# Patient Record
Sex: Female | Born: 1970 | Race: Black or African American | Hispanic: No | Marital: Married | Smoking: Light tobacco smoker
Health system: Southern US, Community
[De-identification: ages and names within clinical notes are randomized; demographics above are authoritative.]

## PROBLEM LIST (undated history)

## (undated) DIAGNOSIS — R42 Dizziness and giddiness: Secondary | ICD-10-CM

## (undated) DIAGNOSIS — S7400XA Injury of sciatic nerve at hip and thigh level, unspecified leg, initial encounter: Secondary | ICD-10-CM

## (undated) DIAGNOSIS — R87619 Unspecified abnormal cytological findings in specimens from cervix uteri: Secondary | ICD-10-CM

## (undated) DIAGNOSIS — R7989 Other specified abnormal findings of blood chemistry: Secondary | ICD-10-CM

## (undated) DIAGNOSIS — G43909 Migraine, unspecified, not intractable, without status migrainosus: Secondary | ICD-10-CM

## (undated) DIAGNOSIS — R945 Abnormal results of liver function studies: Secondary | ICD-10-CM

## (undated) DIAGNOSIS — IMO0002 Reserved for concepts with insufficient information to code with codable children: Secondary | ICD-10-CM

## (undated) HISTORY — DX: Reserved for concepts with insufficient information to code with codable children: IMO0002

## (undated) HISTORY — DX: Migraine, unspecified, not intractable, without status migrainosus: G43.909

## (undated) HISTORY — DX: Unspecified abnormal cytological findings in specimens from cervix uteri: R87.619

## (undated) HISTORY — DX: Other specified abnormal findings of blood chemistry: R79.89

## (undated) HISTORY — DX: Abnormal results of liver function studies: R94.5

## (undated) HISTORY — PX: HEEL SPUR SURGERY: SHX665

## (undated) HISTORY — DX: Dizziness and giddiness: R42

## (undated) HISTORY — DX: Injury of sciatic nerve at hip and thigh level, unspecified leg, initial encounter: S74.00XA

---

## 1999-01-10 ENCOUNTER — Emergency Department (HOSPITAL_COMMUNITY): Admission: EM | Admit: 1999-01-10 | Discharge: 1999-01-10 | Payer: Self-pay | Admitting: Emergency Medicine

## 1999-07-13 ENCOUNTER — Other Ambulatory Visit: Admission: RE | Admit: 1999-07-13 | Discharge: 1999-07-13 | Payer: Self-pay

## 1999-10-20 ENCOUNTER — Emergency Department (HOSPITAL_COMMUNITY): Admission: EM | Admit: 1999-10-20 | Discharge: 1999-10-20 | Payer: Self-pay | Admitting: Emergency Medicine

## 2000-07-12 ENCOUNTER — Other Ambulatory Visit: Admission: RE | Admit: 2000-07-12 | Discharge: 2000-07-12 | Payer: Self-pay

## 2000-10-13 ENCOUNTER — Encounter: Payer: Self-pay | Admitting: Emergency Medicine

## 2000-10-13 ENCOUNTER — Emergency Department (HOSPITAL_COMMUNITY): Admission: EM | Admit: 2000-10-13 | Discharge: 2000-10-13 | Payer: Self-pay | Admitting: Emergency Medicine

## 2001-07-12 ENCOUNTER — Other Ambulatory Visit: Admission: RE | Admit: 2001-07-12 | Discharge: 2001-07-12 | Payer: Self-pay | Admitting: Family Medicine

## 2002-08-27 ENCOUNTER — Other Ambulatory Visit: Admission: RE | Admit: 2002-08-27 | Discharge: 2002-08-27 | Payer: Self-pay | Admitting: Obstetrics and Gynecology

## 2003-01-31 ENCOUNTER — Other Ambulatory Visit: Admission: RE | Admit: 2003-01-31 | Discharge: 2003-01-31 | Payer: Self-pay | Admitting: Obstetrics and Gynecology

## 2003-05-20 ENCOUNTER — Other Ambulatory Visit: Admission: RE | Admit: 2003-05-20 | Discharge: 2003-05-20 | Payer: Self-pay | Admitting: Obstetrics and Gynecology

## 2003-07-07 ENCOUNTER — Other Ambulatory Visit: Admission: RE | Admit: 2003-07-07 | Discharge: 2003-07-07 | Payer: Self-pay | Admitting: *Deleted

## 2003-09-01 ENCOUNTER — Other Ambulatory Visit: Admission: RE | Admit: 2003-09-01 | Discharge: 2003-09-01 | Payer: Self-pay | Admitting: *Deleted

## 2004-05-25 ENCOUNTER — Other Ambulatory Visit: Admission: RE | Admit: 2004-05-25 | Discharge: 2004-05-25 | Payer: Self-pay | Admitting: Obstetrics and Gynecology

## 2004-09-03 ENCOUNTER — Other Ambulatory Visit: Admission: RE | Admit: 2004-09-03 | Discharge: 2004-09-03 | Payer: Self-pay | Admitting: Obstetrics and Gynecology

## 2005-08-08 ENCOUNTER — Other Ambulatory Visit: Admission: RE | Admit: 2005-08-08 | Discharge: 2005-08-08 | Payer: Self-pay | Admitting: Obstetrics and Gynecology

## 2006-08-14 ENCOUNTER — Other Ambulatory Visit: Admission: RE | Admit: 2006-08-14 | Discharge: 2006-08-14 | Payer: Self-pay | Admitting: Obstetrics & Gynecology

## 2007-03-01 ENCOUNTER — Other Ambulatory Visit: Admission: RE | Admit: 2007-03-01 | Discharge: 2007-03-01 | Payer: Self-pay | Admitting: Obstetrics & Gynecology

## 2007-09-05 ENCOUNTER — Other Ambulatory Visit: Admission: RE | Admit: 2007-09-05 | Discharge: 2007-09-05 | Payer: Self-pay | Admitting: *Deleted

## 2007-12-10 ENCOUNTER — Emergency Department (HOSPITAL_COMMUNITY): Admission: EM | Admit: 2007-12-10 | Discharge: 2007-12-10 | Payer: Self-pay | Admitting: Emergency Medicine

## 2008-02-11 ENCOUNTER — Emergency Department (HOSPITAL_COMMUNITY): Admission: EM | Admit: 2008-02-11 | Discharge: 2008-02-11 | Payer: Self-pay | Admitting: Family Medicine

## 2008-04-11 ENCOUNTER — Emergency Department (HOSPITAL_COMMUNITY): Admission: EM | Admit: 2008-04-11 | Discharge: 2008-04-11 | Payer: Self-pay | Admitting: Family Medicine

## 2008-04-24 ENCOUNTER — Other Ambulatory Visit: Admission: RE | Admit: 2008-04-24 | Discharge: 2008-04-24 | Payer: Self-pay | Admitting: Obstetrics & Gynecology

## 2008-07-29 ENCOUNTER — Emergency Department (HOSPITAL_COMMUNITY): Admission: EM | Admit: 2008-07-29 | Discharge: 2008-07-29 | Payer: Self-pay | Admitting: Emergency Medicine

## 2008-08-11 ENCOUNTER — Emergency Department (HOSPITAL_COMMUNITY): Admission: EM | Admit: 2008-08-11 | Discharge: 2008-08-11 | Payer: Self-pay | Admitting: Family Medicine

## 2008-10-16 ENCOUNTER — Other Ambulatory Visit: Admission: RE | Admit: 2008-10-16 | Discharge: 2008-10-16 | Payer: Self-pay | Admitting: Obstetrics & Gynecology

## 2008-10-28 ENCOUNTER — Emergency Department (HOSPITAL_COMMUNITY): Admission: EM | Admit: 2008-10-28 | Discharge: 2008-10-28 | Payer: Self-pay | Admitting: Family Medicine

## 2009-04-01 ENCOUNTER — Other Ambulatory Visit: Admission: RE | Admit: 2009-04-01 | Discharge: 2009-04-01 | Payer: Self-pay | Admitting: Obstetrics & Gynecology

## 2010-08-28 HISTORY — PX: CERVICAL BIOPSY  W/ LOOP ELECTRODE EXCISION: SUR135

## 2011-02-27 DIAGNOSIS — R42 Dizziness and giddiness: Secondary | ICD-10-CM

## 2011-02-27 HISTORY — DX: Dizziness and giddiness: R42

## 2011-08-22 LAB — POCT URINALYSIS DIP (DEVICE)
Bilirubin Urine: NEGATIVE
Ketones, ur: NEGATIVE
Nitrite: NEGATIVE
Protein, ur: NEGATIVE
pH: 6

## 2011-08-31 LAB — URINALYSIS, ROUTINE W REFLEX MICROSCOPIC
Glucose, UA: NEGATIVE
Protein, ur: NEGATIVE
pH: 6

## 2011-08-31 LAB — URINE MICROSCOPIC-ADD ON

## 2011-08-31 LAB — POCT PREGNANCY, URINE: Preg Test, Ur: NEGATIVE

## 2012-08-10 ENCOUNTER — Other Ambulatory Visit: Payer: Self-pay | Admitting: Gastroenterology

## 2012-08-14 ENCOUNTER — Ambulatory Visit
Admission: RE | Admit: 2012-08-14 | Discharge: 2012-08-14 | Disposition: A | Discharge status: Home / Self Care | Payer: 59 | Source: Ambulatory Visit | Attending: Gastroenterology | Admitting: Gastroenterology

## 2013-06-27 ENCOUNTER — Encounter: Payer: Self-pay | Admitting: Obstetrics & Gynecology

## 2013-07-01 ENCOUNTER — Telehealth: Payer: Self-pay | Admitting: Certified Nurse Midwife

## 2013-07-01 ENCOUNTER — Ambulatory Visit (INDEPENDENT_AMBULATORY_CARE_PROVIDER_SITE_OTHER): Payer: 59 | Admitting: Obstetrics & Gynecology

## 2013-07-01 ENCOUNTER — Encounter: Payer: Self-pay | Admitting: Obstetrics & Gynecology

## 2013-07-01 VITALS — BP 100/62 | HR 78 | Resp 16 | Ht 66.0 in | Wt 181.2 lb

## 2013-07-01 DIAGNOSIS — Z124 Encounter for screening for malignant neoplasm of cervix: Secondary | ICD-10-CM

## 2013-07-01 DIAGNOSIS — Z0000 Encounter for general adult medical examination without abnormal findings: Secondary | ICD-10-CM

## 2013-07-01 DIAGNOSIS — Z01419 Encounter for gynecological examination (general) (routine) without abnormal findings: Secondary | ICD-10-CM

## 2013-07-01 LAB — POCT URINALYSIS DIPSTICK
Nitrite, UA: NEGATIVE
Urobilinogen, UA: NEGATIVE
pH, UA: 5

## 2013-07-01 MED ORDER — SUMATRIPTAN SUCCINATE 100 MG PO TABS: 100.0000 mg | ORAL_TABLET | ORAL | Status: DC | PRN

## 2013-07-01 NOTE — Patient Instructions (Signed)

## 2013-07-01 NOTE — Progress Notes (Signed)
42 y.o. [id number] MarriedAfrican AmericanF here for annual exam.  Had two bleeding episodes a month but is very light.  She does have to wear a light day pad.  Headaches are worse.  Patient's last menstrual period was 06/24/2013.          Sexually active: yes  The current method of family planning is IUD.    Exercising: no  not regularly Smoker:  yes  Health Maintenance: Pap:  06/25/12 WNL History of abnormal Pap:  yes MMG:  none Colonoscopy:  none BMD:   none TDaP:  10/04 Screening Labs: today, Hb today: 12.9, Urine today: RBC-trace   reports that she has been smoking.  She has never used smokeless tobacco. She reports that she drinks about 0.5 ounces of alcohol per week. She reports that she does not use illicit drugs.  Past Medical History  Diagnosis Date  . Vertigo 4/12  . Migraine   . Elevated LFTs   . Sciatic nerve injury     strain    Past Surgical History  Procedure Laterality Date  . Heel spur surgery Left     4/09  . Cervical biopsy  w/ loop electrode excision  10/11    CIN I & II- Margins Negative    Current Outpatient Prescriptions  Medication Sig Dispense Refill  . Aspirin-Acetaminophen-Caffeine (EXCEDRIN MIGRAINE PO) Take by mouth as needed.       . Levonorgestrel (SKYLA) 13.5 MG IUD by Intrauterine route.      . Multiple Vitamins-Minerals (MULTIVITAMIN PO) Take by mouth. Ulta Mega MVI       No current facility-administered medications for this visit.    Family History  Problem Relation Age of Onset  . Breast cancer Paternal Grandmother     and ovarian  . Hypertension Mother   . Diabetes Paternal Grandmother     ROS:  Pertinent items are noted in HPI.  Otherwise, a comprehensive ROS was negative.  Exam:   BP 100/62  Pulse 78  Resp 16  Ht 5\' 6"  (1.676 m)  Wt 181 lb 3.2 oz (82.192 kg)  BMI 29.26 kg/m2  LMP 06/24/2013  Weight change: +4lb  Height: 5\' 6"  (167.6 cm)  Ht Readings from Last 3 Encounters:  07/01/13 5\' 6"  (1.676 m)    General  appearance: alert, cooperative and appears stated age Head: Normocephalic, without obvious abnormality, atraumatic Neck: no adenopathy, supple, symmetrical, trachea midline and thyroid normal to inspection and palpation Lungs: clear to auscultation bilaterally Breasts: normal appearance, no masses or tenderness Heart: regular rate and rhythm Abdomen: soft, non-tender; bowel sounds normal; no masses,  no organomegaly Extremities: extremities normal, atraumatic, no cyanosis or edema Skin: Skin color, texture, turgor normal. No rashes or lesions Lymph nodes: Cervical, supraclavicular, and axillary nodes normal. No abnormal inguinal nodes palpated Neurologic: Grossly normal   Pelvic: External genitalia:  no lesions              Urethra:  normal appearing urethra with no masses, tenderness or lesions              Bartholins and Skenes: normal                 Vagina: normal appearing vagina with normal color and discharge, no lesions              Cervix: no lesions, IUD string noted              Pap taken: yes Bimanual Exam:  Uterus:  normal  size, contour, position, consistency, mobility, non-tender              Adnexa: normal adnexa and no mass, fullness, tenderness               Rectovaginal: Confirms               Anus:  normal sphincter tone, no lesions  A:  Well Woman with normal exam 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  Information given. pap smear today.  Needs yearly. Consider replacing Skyla with Mirena in 2 years. Rx for Imitrex 100mg  at headache onset.  Repeat 2 hours.  Max 200mg /24 hours. return annually or prn  An After Visit Summary was printed and given to the patient.

## 2013-07-02 LAB — COMPREHENSIVE METABOLIC PANEL
ALT: 18 U/L (ref 0–35)
Albumin: 4.2 g/dL (ref 3.5–5.2)
CO2: 27 mEq/L (ref 19–32)
Calcium: 9.8 mg/dL (ref 8.4–10.5)
Chloride: 102 mEq/L (ref 96–112)
Creat: 0.79 mg/dL (ref 0.50–1.10)
Total Protein: 7 g/dL (ref 6.0–8.3)

## 2013-07-02 LAB — LIPID PANEL
HDL: 64 mg/dL (ref >39)
LDL Cholesterol: 101 mg/dL — ABNORMAL HIGH (ref 0–99)
Total CHOL/HDL Ratio: 2.7 Ratio
Triglycerides: 50 mg/dL (ref <150)

## 2013-07-02 LAB — IPS PAP SMEAR ONLY

## 2013-08-19 ENCOUNTER — Telehealth: Payer: Self-pay

## 2013-08-19 NOTE — Telephone Encounter (Signed)
Patient c/o spotting x 2 weeks. States was heavier last week, especially Thursday-Sunday. States was having to wear a pad-one all day was fine, but this is abnormal for her. She was also having awful cramping. Please advise, is available to be seen if needed.

## 2013-08-19 NOTE — Telephone Encounter (Signed)
Need appt

## 2013-08-20 NOTE — Telephone Encounter (Signed)
Appointment to evaluate made for 08/22/13 with Dr Hyacinth Meeker. Patient aware to call if symptoms worsen.

## 2013-08-22 ENCOUNTER — Telehealth: Payer: Self-pay | Admitting: Obstetrics & Gynecology

## 2013-08-22 ENCOUNTER — Ambulatory Visit (INDEPENDENT_AMBULATORY_CARE_PROVIDER_SITE_OTHER): Payer: 59 | Admitting: Obstetrics & Gynecology

## 2013-08-22 VITALS — BP 120/80 | HR 60 | Resp 16 | Ht 66.25 in | Wt 182.2 lb

## 2013-08-22 DIAGNOSIS — N764 Abscess of vulva: Secondary | ICD-10-CM

## 2013-08-22 DIAGNOSIS — N938 Other specified abnormal uterine and vaginal bleeding: Secondary | ICD-10-CM

## 2013-08-22 DIAGNOSIS — G43909 Migraine, unspecified, not intractable, without status migrainosus: Secondary | ICD-10-CM

## 2013-08-22 DIAGNOSIS — N949 Unspecified condition associated with female genital organs and menstrual cycle: Secondary | ICD-10-CM

## 2013-08-22 MED ORDER — RIZATRIPTAN BENZOATE 10 MG PO TABS: 10.0000 mg | ORAL_TABLET | ORAL | Status: DC | PRN

## 2013-08-22 MED ORDER — SULFAMETHOXAZOLE-TMP DS 800-160 MG PO TABS: 1.0000 | ORAL_TABLET | Freq: Two times a day (BID) | ORAL | Status: DC

## 2013-08-22 NOTE — Telephone Encounter (Signed)
Routed to triage 

## 2013-08-22 NOTE — Telephone Encounter (Signed)
pharmacy calling to see if they can give the pt. 18 tablets instead of 2 or 9. The patients insurance will not cover unless it is written for 18 tablets.

## 2013-08-22 NOTE — Progress Notes (Signed)
Subjective:     Patient ID: Heather Acevedo, female   DOB: 08-Jul-1971, 42 y.o.   MRN: [id number]  HPI 43 yo G2A2 here to discuss several issues.  1)  Did have a migraine two weeks ago.  She took an Imitrex and it made her feel very "spacey".  It did help the headache but she didn't like the way it felt.  Wants to know if there are other options as this really did help headache.  2) Since IUD placement, has had short and light "staining" instead of cycles except for this month when she had what felt like a more normal cycle for her with cramping, heavier flow, and small clots.  Using IUD for [facility], so anxious about this.  3) Has h/o vulvar abscess and scarring, possible hidradenitis.  She had an abscess that came up so quickly and ruptured yesterday.  Area around it is still very tender to pt's touch.  Wants me to look at it.  No fevers.  No vaginal discharge.  No urinary symptoms.     Review of Systems  All other systems reviewed and are negative.       Objective:   Physical Exam  Constitutional: She is oriented to person, place, and time. She appears well-developed and well-nourished.  Abdominal: Soft. Bowel sounds are normal. She exhibits no distension and no mass. There is no tenderness. There is no rebound and no guarding.  Genitourinary: Vagina normal and uterus normal.    Uterus is not enlarged and not tender. Cervix exhibits motion tenderness. Cervix exhibits no discharge (with 2cm IUD string noted).  Lymphadenopathy:       Right: Inguinal adenopathy present.  Neurological: She is alert and oriented to person, place, and time.  Skin: Skin is warm and dry.  Psychiatric: She has a normal mood and affect.       Assessment:     Migraines Recurrent vulvar abscesses/hidratenitis? Cigar smoker DUB with Mirena IUD     Plan:     Bactrim DS bid x 7 days Skin culture of ruptured abscess maxalt 10mg  at HA onset, may repeat in 2 hours.  Rx to pharmacy.  May need prior-authorization PUS  if has continued irregular bleeding.  Pt to give update with next bleeding. Dermatology referral

## 2013-08-22 NOTE — Telephone Encounter (Signed)
Spoke with pharmacy, [facility] and advised new order was in system.

## 2013-08-23 ENCOUNTER — Encounter: Payer: Self-pay | Admitting: Obstetrics & Gynecology

## 2013-08-23 ENCOUNTER — Ambulatory Visit: Payer: Self-pay | Admitting: Obstetrics & Gynecology

## 2013-08-23 NOTE — Patient Instructions (Signed)
Will call with culture report and may need to continue antibiotics/change antibiotics depending on results.

## 2013-08-25 LAB — WOUND CULTURE: Gram Stain: NONE SEEN

## 2013-08-26 ENCOUNTER — Telehealth: Payer: Self-pay

## 2013-08-26 NOTE — Telephone Encounter (Signed)
Lmtcb//kn 

## 2013-08-26 NOTE — Telephone Encounter (Signed)
Spoke with patient re: culture results.

## 2013-08-26 NOTE — Telephone Encounter (Signed)
Message copied by Elisha Headland on Mon Aug 26, 2013 11:33 AM ------      Message from: Jerene Bears      Created: Sun Aug 25, 2013  4:08 PM       Please call and inform culture did not grow any bacteria.  This is probably because wasn't draining much when she came.  How is area.  She should still be on antibiotics and I want her to finish all 7 days. ------

## 2013-08-26 NOTE — Telephone Encounter (Signed)
Pt returning call

## 2013-08-26 NOTE — Telephone Encounter (Signed)
Patient notified of all results. 

## 2013-09-12 ENCOUNTER — Telehealth: Payer: Self-pay | Admitting: Obstetrics & Gynecology

## 2013-09-12 NOTE — Telephone Encounter (Signed)
Called patient to follow up with her in regards to her referral to dermatology. Patient's work location is closing and patient is transitioning to a new location. Her last day at this location is 11/16. Patient will reschedule after she is settle with her new location.  Patient tried to take Maxalt for her migraines but she had an allergic reaction to the medication. She stopped taking it immediately. Patient is going to go back to exedrin migraine for the time being.

## 2013-10-17 ENCOUNTER — Other Ambulatory Visit: Payer: Self-pay

## 2013-10-17 DIAGNOSIS — Z1231 Encounter for screening mammogram for malignant neoplasm of breast: Secondary | ICD-10-CM

## 2013-11-14 ENCOUNTER — Ambulatory Visit
Admission: RE | Admit: 2013-11-14 | Discharge: 2013-11-14 | Disposition: A | Discharge status: Home / Self Care | Payer: 59 | Source: Ambulatory Visit

## 2013-11-14 DIAGNOSIS — Z1231 Encounter for screening mammogram for malignant neoplasm of breast: Secondary | ICD-10-CM

## 2014-08-07 ENCOUNTER — Telehealth: Payer: Self-pay | Admitting: Obstetrics & Gynecology

## 2014-08-07 NOTE — Telephone Encounter (Signed)
Spoke with patient. Patient states that she has always had spotting 2-3 days out of the month with her IUD since it was inserted. States that yesterday she began to have bleeding that was a little more than spotting and "rich red" in color. Patient is changing panti liner 2 times per day. Patient denies any other symptoms."I just want to make sure that this is normal since it is heavier and a different color." Advised patient would send a message over to [place] and give her a call back with further recommendations and instructions. Patient agreeable.

## 2014-08-07 NOTE — Telephone Encounter (Signed)
Pt has questions about her iud.

## 2014-08-08 NOTE — Telephone Encounter (Signed)
As long as she can feel the strings, there is nothing to worry about.  If cannot feel strings, needs OV.

## 2014-08-08 NOTE — Telephone Encounter (Signed)
Spoke with patient. Patient states "She knows that I don't want to feel those things." Advised of importance of doing so. Advised if can not feel strings will need to be seen for office visit with [facility]. If she is able to feel them everything is okay. Patient is agreeable and will check for stings. Will call back if unable to feel to schedule appointment.  Routing to provider for final review. Patient agreeable to disposition. Will close encounter

## 2014-09-16 ENCOUNTER — Ambulatory Visit (INDEPENDENT_AMBULATORY_CARE_PROVIDER_SITE_OTHER): Payer: Managed Care, Other (non HMO) | Admitting: Obstetrics & Gynecology

## 2014-09-16 ENCOUNTER — Ambulatory Visit
Admission: RE | Admit: 2014-09-16 | Discharge: 2014-09-16 | Disposition: A | Discharge status: Home / Self Care | Payer: 59 | Source: Ambulatory Visit | Attending: Obstetrics & Gynecology | Admitting: Obstetrics & Gynecology

## 2014-09-16 ENCOUNTER — Encounter: Payer: Self-pay | Admitting: Obstetrics & Gynecology

## 2014-09-16 ENCOUNTER — Other Ambulatory Visit: Payer: Self-pay | Admitting: Obstetrics & Gynecology

## 2014-09-16 VITALS — BP 128/78 | HR 82 | Resp 16 | Ht 66.5 in | Wt 172.0 lb

## 2014-09-16 DIAGNOSIS — M899 Disorder of bone, unspecified: Secondary | ICD-10-CM

## 2014-09-16 DIAGNOSIS — Z975 Presence of (intrauterine) contraceptive device: Secondary | ICD-10-CM | POA: Insufficient documentation

## 2014-09-16 DIAGNOSIS — Z01419 Encounter for gynecological examination (general) (routine) without abnormal findings: Secondary | ICD-10-CM

## 2014-09-16 DIAGNOSIS — N938 Other specified abnormal uterine and vaginal bleeding: Secondary | ICD-10-CM | POA: Insufficient documentation

## 2014-09-16 DIAGNOSIS — Z124 Encounter for screening for malignant neoplasm of cervix: Secondary | ICD-10-CM

## 2014-09-16 DIAGNOSIS — Z0000 Encounter for general adult medical examination without abnormal findings: Secondary | ICD-10-CM

## 2014-09-16 LAB — POCT URINALYSIS DIPSTICK
LEUKOCYTES UA: NEGATIVE
PH UA: 5
Urobilinogen, UA: NEGATIVE

## 2014-09-16 LAB — HEMOGLOBIN, FINGERSTICK: Hemoglobin, fingerstick: 12.9 g/dL (ref 12.0–16.0)

## 2014-09-16 MED ORDER — SUMATRIPTAN SUCCINATE 100 MG PO TABS: 100.0000 mg | ORAL_TABLET | ORAL | Status: DC | PRN

## 2014-09-16 NOTE — Progress Notes (Signed)
43 y.o. G2P0020 Married African AmericanF here for annual exam.  Moved to [place] for a job promotion.  Still spotting twice a month.    Patient's last menstrual period was 09/02/2014.          Sexually active: Yes.    The current method of family planning is Skyla  IUD.    Placed 9/13. Exercising: Yes.    The patient does not participate in regular exercise at present. Smoker:  Yes 2-3x/wk  Health Maintenance: Pap:  07/01/13 Negative History of abnormal Pap: Yes  MMG: 11/15/13 Bi-Rads 1: Negative Colonoscopy:  Never had one BMD:   Never had done TDaP:  08/29/2003 Screening Labs: Yes , Hb today: 12.9 , Urine today: RBC'S ++   reports that she has been smoking.  She has never used smokeless tobacco. She reports that she drinks about .5 - 1 ounces of alcohol per week. She reports that she does not use illicit drugs.  Past Medical History  Diagnosis Date  . Vertigo 4/12  . Migraine   . Elevated LFTs   . Sciatic nerve injury     strain  . Abnormal Pap smear     LEEP CIN I and II, margins negative    Past Surgical History  Procedure Laterality Date  . Heel spur surgery Left     4/09  . Cervical biopsy  w/ loop electrode excision  10/11    CIN I & II- Margins Negative    Current Outpatient Prescriptions  Medication Sig Dispense Refill  . Acetaminophen (TYLENOL EXTRA STRENGTH PO) Take by mouth as needed.      . Aspirin-Acetaminophen-Caffeine (EXCEDRIN MIGRAINE PO) Take by mouth as needed.       . Levonorgestrel (SKYLA) 13.5 MG IUD by Intrauterine route.      . Multiple Vitamins-Minerals (MULTIVITAMIN PO) Take by mouth. Ulta Mega MVI      . SUMAtriptan (IMITREX) 100 MG tablet Take 1 tablet (100 mg total) by mouth every 2 (two) hours as needed for migraine.  9 tablet  12   No current facility-administered medications for this visit.    Family History  Problem Relation Age of Onset  . Breast cancer Paternal Grandmother     and ovarian  . Hypertension Mother   . Diabetes  Paternal Grandmother     ROS:  Pertinent items are noted in HPI.  Otherwise, a comprehensive ROS was negative.  Exam:   BP 128/78  Pulse 82  Resp 16  Ht 5' 6.5" (1.689 m)  Wt 172 lb (78.019 kg)  BMI 27.35 kg/m2  LMP 09/02/2014  Weight change: -10#  Height: 5' 6.5" (168.9 cm)  Ht Readings from Last 3 Encounters:  09/16/14 5' 6.5" (1.689 m)  08/22/13 5' 6.25" (1.683 m)  07/01/13 5\' 6"  (1.676 m)    General appearance: alert, cooperative and appears stated age Head: Normocephalic, without obvious abnormality, atraumatic Neck: no adenopathy, supple, symmetrical, trachea midline and thyroid normal to inspection and palpation Lungs: clear to auscultation bilaterally Breasts: normal appearance, no masses or tenderness, rib prominence on Left chest wall Heart: regular rate and rhythm Abdomen: soft, non-tender; bowel sounds normal; no masses,  no organomegaly Extremities: extremities normal, atraumatic, no cyanosis or edema Skin: Skin color, texture, turgor normal. No rashes or lesions Lymph nodes: Cervical, supraclavicular, and axillary nodes normal. No abnormal inguinal nodes palpated Neurologic: Grossly normal   Pelvic: External genitalia:  no lesions  Urethra:  normal appearing urethra with no masses, tenderness or lesions              Bartholins and Skenes: normal                 Vagina: normal appearing vagina with normal color and discharge, no lesions              Cervix: no lesions, IUD string noted              Pap taken: Yes.  Bleeding with pap noted.  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 Information given.  pap smear today. Needs yearly.  D/W pt PUS to assess DUB which I feel I probably IUD related.  Also discussed BTL or vasectomy with subsequent IUD removal.  Pt will consider CXR today.  Rx given. Rx for Imitrex 100mg  at headache onset. Repeat 2 hours. Max 200mg /24 hours.  #9/13 return annually or prn   An After Visit Summary was printed and given to the patient.

## 2014-09-17 ENCOUNTER — Telehealth: Payer: Self-pay | Admitting: Obstetrics & Gynecology

## 2014-09-17 LAB — IPS PAP TEST WITH REFLEX TO HPV

## 2014-09-17 NOTE — Telephone Encounter (Signed)
Dr.Silva, could you please review patient's chest x-ray results from 10/20 as Dr.Miller is out of the office today. Thank you.

## 2014-09-17 NOTE — Telephone Encounter (Signed)
Pt is calling to check and see if images are in from a visit she had yesterday.

## 2014-09-17 NOTE — Telephone Encounter (Signed)
Patient notified. Will follow up prn.  Routing to provider for final review. Patient agreeable to disposition. Will close encounter

## 2014-09-17 NOTE — Telephone Encounter (Signed)
Message copied by Michele Mcalpine on Wed Sep 17, 2014 11:39 AM ------      Message from: [name]      Created: Wed Sep 17, 2014  8:28 AM       Please let pt know CXR did not show any abnormalities. ------

## 2014-09-29 ENCOUNTER — Encounter: Payer: Self-pay | Admitting: Obstetrics & Gynecology

## 2014-11-13 ENCOUNTER — Telehealth: Payer: Self-pay | Admitting: Emergency Medicine

## 2014-11-13 NOTE — Telephone Encounter (Signed)
-----   Message from Lyman Speller, MD sent at 11/09/2014  2:04 PM EST ----- Regarding: ultrasound in [place] Pt with hx of DUB.  Has IUD and h/o fibroids.  Needs PUS in [place].  Can you see if can get scheduled.  Pt moved there due to promotion but doesn't want to change gynecologists right now.  Pt seen in October and I've been holding this on my desk for weeks to get scheduled.  She will not be surprised by call or upset.  She was very busy with move and wanted to wait some weeks before getting it scheduled.  Thanks.  MSM

## 2014-11-13 NOTE — Telephone Encounter (Signed)
Spoke with patient. Agreeable to scheduling Pelvic ultrasound  And transvaginal ultrasound for 11/17/14 at 1215.  Scheduled at [facility] at [facility]. [place], [place] [address] phone [phone] Faxed signed order with fax confirmation received.  Patient aware, instructions to drink 32 oz of water one hour prior to appointment and hold bladder given.   Patient agreeable to instructions and plan. Will call with any concerns. Imaging hold for results.

## 2014-12-04 ENCOUNTER — Telehealth: Payer: Self-pay | Admitting: Emergency Medicine

## 2014-12-04 NOTE — Telephone Encounter (Signed)
Encounter can be closed.  Pt was aware of recommendation and appt made for her.  Non-compliance noted.  Thank you for the update.

## 2014-12-04 NOTE — Telephone Encounter (Signed)
Dr. Sabra Heck,    Patient was scheduled for 11/17/14 in [place] for pelvic ultrasound. She confirmed the appointment with me on the phone encounter dated 11/13/14. She subsequently called the imaging center and said she was going out of town and did not want to reschedule, that she would call back to schedule when she returned to town. Patient has not since rescheduled.

## 2015-01-20 ENCOUNTER — Telehealth: Payer: Self-pay | Admitting: Obstetrics & Gynecology

## 2015-01-20 DIAGNOSIS — N83202 Unspecified ovarian cyst, left side: Principal | ICD-10-CM

## 2015-01-20 DIAGNOSIS — N83201 Unspecified ovarian cyst, right side: Secondary | ICD-10-CM

## 2015-01-20 NOTE — Telephone Encounter (Signed)
Spoke with patient. Patient states that she had PUS done in [place] on 2/19. "They said that they would send the results over to the office that day and I should hear from Someone Tuesday at the latest." Advised that Dr.Miller is out of the office this week but I will look for results and have covering provider Dr.Silva review and return call with further information. Patient is agreeable.

## 2015-01-20 NOTE — Telephone Encounter (Signed)
Spoke with [employer] in [place] results from ultrasound on 2/19 will be faxed to our office for Dr.Silva's review at this time.

## 2015-01-20 NOTE — Telephone Encounter (Signed)
Spoke with patient. Advised I have contacted [facility] for results and will give her a call as soon as we have received them and Dr.Silva has reviewed. Patient is agreeable.

## 2015-01-20 NOTE — Telephone Encounter (Signed)
Patient is calling to get her results from her ultrasound

## 2015-01-22 NOTE — Telephone Encounter (Signed)
Attempted to reach patient at number provided [phone]. No answer and no voicemail available. Will try again later.

## 2015-01-22 NOTE — Telephone Encounter (Signed)
Report from [facility] 01/16/15  Uterus 8.1 x 3.4 x 4.2 cm.  9 mm fundal fibroid.  EMS 2 mm. Right ovary 6.5 x 5.1 x 3.8 cm.  4.6 cm anechoic cyst consistent with physiologic follicle. Left ovary 2.0 x 2.7 x 2.3 cm.  Complex septate hypoechoic mass consistent with Cl or hemorrhagic cyst.  Small free fluid in cul de sac.   Recommendations as below.

## 2015-01-22 NOTE — Telephone Encounter (Signed)
Patient calling to check on the status of the results.

## 2015-01-22 NOTE — Telephone Encounter (Signed)
Spoke with patient. Advised patient we have received results from [facility] which Dr.Silva has reviewed. Patient has bilateral ovarian cysts with a very small fibroid. Dr.Silva recommends a follow up ultrasound with Dr.Miller in 6 weeks. Appointment scheduled for 03/05/15 at 1pm with 1:30pm consult with Dr.Miller. Patient is agreeable to date and time. Patient verbalized understanding of the [facility] appointment cancellation policy. Advised will need to cancel or reschedule within 72 business hours of appointment (3 business days) or will have $100.00 late cancellation fee placed to account. Torsion precautions given. PUS order placed. Will need precert. Results sent to scan.  SW:FUXNATF [place] [employer] for [employer] to provider for final review. Patient agreeable to disposition. Will close encounter

## 2015-01-22 NOTE — Telephone Encounter (Signed)
Dr.Silva results from PUS to your desk for review.

## 2015-01-26 NOTE — Telephone Encounter (Signed)
The ultrasound did not say anything about presence of IUD.  Please call and ask [facility] to look at images and advise if they can see IUD.  Thanks.

## 2015-01-26 NOTE — Telephone Encounter (Signed)
Spoke with [facility]. Will have Dr.Gordon review results and make any additional notes regarding IUD. Once report has been reviewed new report will be faxed with corrections to our office.

## 2015-02-02 NOTE — Telephone Encounter (Signed)
Did receive addended ultrasound report from [facility] with IUD in correct location.  Pt already scheduled for ultrasound here in early April.  Thanks.

## 2015-03-05 ENCOUNTER — Ambulatory Visit (INDEPENDENT_AMBULATORY_CARE_PROVIDER_SITE_OTHER): Payer: Managed Care, Other (non HMO) | Admitting: Obstetrics & Gynecology

## 2015-03-05 ENCOUNTER — Ambulatory Visit (INDEPENDENT_AMBULATORY_CARE_PROVIDER_SITE_OTHER): Payer: Managed Care, Other (non HMO)

## 2015-03-05 VITALS — BP 102/70 | Wt 172.0 lb

## 2015-03-05 DIAGNOSIS — N8320 Unspecified ovarian cysts: Secondary | ICD-10-CM

## 2015-03-05 DIAGNOSIS — D251 Intramural leiomyoma of uterus: Secondary | ICD-10-CM | POA: Diagnosis not present

## 2015-03-05 DIAGNOSIS — N83201 Unspecified ovarian cyst, right side: Secondary | ICD-10-CM

## 2015-03-05 DIAGNOSIS — N938 Other specified abnormal uterine and vaginal bleeding: Secondary | ICD-10-CM | POA: Diagnosis not present

## 2015-03-05 DIAGNOSIS — N83202 Unspecified ovarian cyst, left side: Principal | ICD-10-CM

## 2015-03-05 DIAGNOSIS — Z975 Presence of (intrauterine) contraceptive device: Secondary | ICD-10-CM

## 2015-03-05 MED ORDER — FLUOXETINE HCL 20 MG PO CAPS: 20.0000 mg | ORAL_CAPSULE | Freq: Every day | ORAL | Status: DC

## 2015-03-05 MED ORDER — FLUOXETINE HCL (PMDD) 20 MG PO CAPS: 10.0000 mg | ORAL_CAPSULE | Freq: Every day | ORAL | Status: DC

## 2015-03-05 NOTE — Progress Notes (Signed)
44 y.o. Heather Acevedo Married AA female here for a pelvic ultrasound to follow up on 4.6cm right ovarian cyst that was noted on outside ultrasound done in February due to DUB with her Heather Acevedo IUD.  Pt also had a probable corpus luteal cyst on the left ovary at the same time.  IUD was noted to be in correct location.  Pt denies pain during any time during the last several months.  Biggest frustration to her is the DUB.  Pt is still smoking and cannot be on estrogen containing OCPs.  She is clearly aware of this despite doing very well on them in the past.  Pt not in [place] any longer.  Was promoted again and is now is [place].  She lives part time with her sister and commutes back and forth, some, to [place] where husband lives.  No LMP recorded.  Sexually active:  yes  Contraception: Skyla IUD  FINDINGS: UTERUS: 6.1 x 5.5 x 3.7cm with fundal 61mm fibroid EMS: IUD in correct location ADNEXA:   Left ovary 3.0 x 1.4 x 1.7cm with collapsed 1.2cm corpus luteal cyst   Right ovary 2.9 x 2.1 x 5.1ZG with 0.1VC follicle CUL DE SAC: no free fluid  Images reviewed with pt.  Absence of large cyst is noted.  Corpus luteal cyst on left noted.  Small fibroid noted.  Correct IUD location note.  Pt aware of above findings.  Would like to proceed with another IUD at the end of the summer, when IUD should be removed.  Assessment:  DUB with Skyla IUD, correct IUD location Small 30mm fundal fibroid 4.6cm right ovarian cyst, resolved Anxiety with work stressors  Plan: Return for hopeful Mirena IUD placement in late 8/16.  Will have IUD placement and IUD precerted for pt.   Restart Prozac 20mg  daily.  rx to pharmacy.  Pt aware of risks of serotonin syndrome and nausea during first week of use.  ~15 minutes spent with patient >50% of time was in face to face discussion of above.

## 2015-03-06 ENCOUNTER — Other Ambulatory Visit: Payer: Self-pay

## 2015-03-06 DIAGNOSIS — Z30014 Encounter for initial prescription of intrauterine contraceptive device: Secondary | ICD-10-CM

## 2015-03-09 ENCOUNTER — Encounter: Payer: Self-pay | Admitting: Obstetrics & Gynecology

## 2015-03-09 DIAGNOSIS — D251 Intramural leiomyoma of uterus: Secondary | ICD-10-CM | POA: Insufficient documentation

## 2015-05-26 ENCOUNTER — Telehealth: Payer: Self-pay | Admitting: Obstetrics & Gynecology

## 2015-05-26 MED ORDER — SULFAMETHOXAZOLE-TRIMETHOPRIM 800-160 MG PO TABS: 1.0000 | ORAL_TABLET | Freq: Two times a day (BID) | ORAL | Status: DC

## 2015-05-26 NOTE — Telephone Encounter (Signed)
Call to patient. She is advised that Dr. Sabra Heck approved prescription treatment for her. Advised to return call if symptoms worsen or do not improve after treatment with antibiotics in 1-2 days. Advised patient to be seen at local ER or Urgent care if develops fevers, chills, or any concerns. Patient verbalized understanding. Will follow up as directed.   Routing to provider for final review. Patient agreeable to disposition. Will close encounter.

## 2015-05-26 NOTE — Telephone Encounter (Signed)
Call to patient. She c/o L vulvar abscess x 2 days. She states she has a history of these abscesses and states she was advised by Dr. Sabra Heck at previous visits that she could call in treatment if necessary. Denies fevers, or swelling of the area, but reports pain. She states she has been "leaving the area alone" as directed by Dr. Sabra Heck previously.  Patient declines office visit offered today for evaluation. Patient states she works in [place] and schedule is unpredictable.   Advised would send message to Dr. Sabra Heck for recommendations.

## 2015-05-26 NOTE — Telephone Encounter (Signed)
Patient has a cyst.

## 2015-05-26 NOTE — Telephone Encounter (Signed)
OK to treat with bactrim DS bid x 7 days.  Thanks.

## 2015-06-21 ENCOUNTER — Other Ambulatory Visit: Payer: Self-pay | Admitting: Obstetrics & Gynecology

## 2015-06-22 NOTE — Telephone Encounter (Signed)
Medication refill request: Prozac 20 Last AEX:  09/16/14 with SM Next AEX: 11/02/15 with SM Last MMG (if hormonal medication request):  Refill authorized: Please advise.

## 2015-07-10 ENCOUNTER — Ambulatory Visit (INDEPENDENT_AMBULATORY_CARE_PROVIDER_SITE_OTHER): Payer: Managed Care, Other (non HMO) | Admitting: Obstetrics & Gynecology

## 2015-07-10 VITALS — BP 100/62 | HR 64 | Resp 16 | Wt 157.0 lb

## 2015-07-10 DIAGNOSIS — Z30433 Encounter for removal and reinsertion of intrauterine contraceptive device: Secondary | ICD-10-CM

## 2015-07-10 DIAGNOSIS — Z30014 Encounter for initial prescription of intrauterine contraceptive device: Secondary | ICD-10-CM

## 2015-07-10 MED ORDER — SUMATRIPTAN SUCCINATE 100 MG PO TABS: 100.0000 mg | ORAL_TABLET | ORAL | Status: DC | PRN

## 2015-07-10 NOTE — Addendum Note (Signed)
Addended by: [name] on: 07/10/2015 11:03 AM   Modules accepted: Orders

## 2015-07-10 NOTE — Progress Notes (Addendum)
44 y.o. [id number] Married [place] American female presents for removal of Skyla IUD and insertion of Skyla IUD. She has been counseled about alternative forms of birth control including oral contraceptives, progesterone methods, IUD, barrier method, and sterilization.  She has decided to proceed with IUD placement.  Currently, she denies any vaginal symptoms or STD concerns.  LMP:  Patient's last menstrual period was 07/07/2015.  Gen:  WNWF healthy female NAD Abdomen: soft, non-tender Groin:  no inguinal nodes palpated  Pelvic exam: Vulva:  normal female genitalia Vagina:  normal vagina, no discharge, exudate, lesion, or erythema Cervix:  Non-tender, Negative CMT, no lesions or redness, IUD string noted  After patient read information booklet and all questions were answered, informed consent was obtained.      Procedure:  Speculum inserted into vagina. Cervix visualized and cleansed with betadine solution X 3. Paracervical block placed:  yes, 1% Lidocaine lot # [id number].  Exp 11/29/15.  Tenaculum placed on cervix at 12 o'clock position.  IUD removed with one pull of IUD string.  Uterus sounded to 6 centimeters.  IUD and inserting device removed from sterile packet and under sterile conditions inserted to fundus of uterus.  IUD released and introducer removed without difficulty.  IUD string trimmed to 2 centimeters.  Remainder string given to patient to feel for identification.  Tenaculum removed.  Minimal bleeding noted.  Speculum removed.  Uterus palpated normal.  Patient tolerated procedure well.  Skyla IUD Lot #:[id number].  Exp: 2/17.  Package information attached to consent and scanned into EPIC.  A: Removal of Skyla IUD and Insertion of new Skyla IUD   P: Return to office 6-8 weeks for recheck Pt knows IUD needs to be replaced approximately 3 years, no later than 07/10/15.  Instructions provided.  Imitrex rx transferred to [facility] in [place] per pt request.

## 2015-07-10 NOTE — Addendum Note (Signed)
Addended by: [name] on: 07/10/2015 11:01 AM   Modules accepted: Level of Service

## 2015-08-24 ENCOUNTER — Telehealth: Payer: Self-pay | Admitting: Obstetrics & Gynecology

## 2015-08-24 ENCOUNTER — Ambulatory Visit (INDEPENDENT_AMBULATORY_CARE_PROVIDER_SITE_OTHER): Payer: Managed Care, Other (non HMO) | Admitting: Obstetrics and Gynecology

## 2015-08-24 ENCOUNTER — Ambulatory Visit (HOSPITAL_COMMUNITY)
Admission: RE | Admit: 2015-08-24 | Discharge: 2015-08-24 | Disposition: A | Discharge status: Home / Self Care | Payer: Managed Care, Other (non HMO) | Source: Ambulatory Visit | Attending: Obstetrics and Gynecology | Admitting: Obstetrics and Gynecology

## 2015-08-24 ENCOUNTER — Encounter: Payer: Self-pay | Admitting: Obstetrics and Gynecology

## 2015-08-24 ENCOUNTER — Telehealth: Payer: Self-pay

## 2015-08-24 VITALS — BP 118/80 | HR 80 | Resp 16 | Wt 161.0 lb

## 2015-08-24 DIAGNOSIS — R1032 Left lower quadrant pain: Secondary | ICD-10-CM | POA: Diagnosis present

## 2015-08-24 DIAGNOSIS — N8320 Unspecified ovarian cysts: Secondary | ICD-10-CM | POA: Diagnosis not present

## 2015-08-24 DIAGNOSIS — R319 Hematuria, unspecified: Secondary | ICD-10-CM | POA: Diagnosis not present

## 2015-08-24 DIAGNOSIS — Z975 Presence of (intrauterine) contraceptive device: Secondary | ICD-10-CM | POA: Insufficient documentation

## 2015-08-24 DIAGNOSIS — D259 Leiomyoma of uterus, unspecified: Secondary | ICD-10-CM | POA: Diagnosis not present

## 2015-08-24 DIAGNOSIS — N83202 Unspecified ovarian cyst, left side: Secondary | ICD-10-CM

## 2015-08-24 LAB — POCT URINALYSIS DIPSTICK
Bilirubin, UA: NEGATIVE
GLUCOSE UA: NEGATIVE
Ketones, UA: NEGATIVE
Leukocytes, UA: NEGATIVE
NITRITE UA: NEGATIVE
Protein, UA: NEGATIVE
UROBILINOGEN UA: NEGATIVE
pH, UA: 7

## 2015-08-24 MED ORDER — OXYCODONE-ACETAMINOPHEN 5-325 MG PO TABS: ORAL_TABLET | ORAL | Status: DC

## 2015-08-24 NOTE — Patient Instructions (Signed)
Call with worsening pain or any other concerns, f/u in 48 hours if your pain isn't improving.

## 2015-08-24 NOTE — Telephone Encounter (Signed)
Spoke with patient. She complaints of L sided pain that she feels is coming from her L ovary that started this morning. She describes pain as sharp and piercing. Using ibuprofen without relief. Patient denies vaginal bleeding or discharge. No pain with urination. No fevers, no nausea, no vomiting, no diarrhea. Patient has not checked her IUD strings. She is coming from [place] and feels she can wait to come here for appointment, husband is driving her. Advised patient if pain increases or any concerns to be seek care locally at emergency department. Patient verbalizes understanding.   Patient with history of Ovarian Cyst/Fibroids. Last ultrasound with Dr. Sabra Heck 02/2015. Skyla IUD for contraception, placed by Dr. Sabra Heck 07/10/15.   Scheduled office visit with Dr. Talbert Nan today at 1100. Patient agreeable.  Routing to provider for final review. Patient agreeable to disposition. Will close encounter.

## 2015-08-24 NOTE — Progress Notes (Signed)
Scheduled patient while in office today for STAT ultrasound at [facility] for today at 1:15 pm. Patient is agreeable to date and time. Report to be called to office to [place].

## 2015-08-24 NOTE — Telephone Encounter (Signed)
Took report at time of incoming call from [place] at the [facility] on patient's ultrasound results. Results seen below. Results discussed with Dr.Jertson who would like patient to schedule follow up PUS with our office. Patient will need to return call with worsening symptoms. Needs to advise this cyst should go away with time, but need to monitor.  IMPRESSION: 1. No acute abnormality. 2. Complex LEFT ovarian cystic lesion with thick septations with vascular flow. 6-12 week follow-up ultrasound recommended to assess for resolution. 3. Doppler waveform evaluation was not ordered or performed so definitive evaluation for portion is not possible.

## 2015-08-24 NOTE — Telephone Encounter (Signed)
Patient called and said, "I would like an appointment today. I am having sharp stabbing pains in my left ovary I am not sure may be related to my IUD. I called into work today because the pain is so bad."

## 2015-08-24 NOTE — Progress Notes (Signed)
Patient ID: Heather Acevedo, female   DOB: September 14, 1971, 44 y.o.   MRN: [id number] GYNECOLOGY  VISIT   HPI: 44 y.o.   Married  [place] American  female   [phone] with Patient's last menstrual period was 08/08/2015.   here for   LLQ pain that started this morning. The patient got up to go to the bathroom this morning. She had a BM, the pain started right after that. The pain is sharp and constant. 9/10 in severity. Helps to curl up a little or if she pushes on her abdomen when she gets up. Ibuprofen didn't help at all. No fever, chills, nausea, emesis, diarrhea, no constipation, no urinary symptoms. The patient has a skyla IUD, placed last month. She has light menses every month. LMP 08/08/15.   GYNECOLOGIC HISTORY: Patient's last menstrual period was 08/08/2015. Contraception:IUD Menopausal hormone therapy: N/A        OB History    Gravida Para Term Preterm AB TAB SAB Ectopic Multiple Living   2    2     0         Patient Active Problem List   Diagnosis Date Noted  . Fibroids, intramural 03/09/2015  . DUB (dysfunctional uterine bleeding) 09/16/2014  . IUD (intrauterine device) in place 09/16/2014    Past Medical History  Diagnosis Date  . Vertigo 4/12  . Migraine   . Elevated LFTs   . Sciatic nerve injury     strain  . Abnormal Pap smear     LEEP CIN I and II, margins negative    Past Surgical History  Procedure Laterality Date  . Heel spur surgery Left     4/09  . Cervical biopsy  w/ loop electrode excision  10/11    CIN I & II- Margins Negative    Current Outpatient Prescriptions  Medication Sig Dispense Refill  . Acetaminophen (TYLENOL EXTRA STRENGTH PO) Take by mouth as needed.    . Aspirin-Acetaminophen-Caffeine (EXCEDRIN MIGRAINE PO) Take by mouth as needed.     Marland Kitchen FLUoxetine (PROZAC) 20 MG capsule TAKE 1 CAPSULE (20 MG TOTAL) BY MOUTH DAILY. 30 capsule 5  . Levonorgestrel (SKYLA) 13.5 MG IUD by Intrauterine route.    . Multiple Vitamins-Minerals (MULTIVITAMIN PO)  Take by mouth. Ulta Mega MVI    . SUMAtriptan (IMITREX) 100 MG tablet Take 1 tablet (100 mg total) by mouth every 2 (two) hours as needed for migraine. 9 tablet 5  . trimethoprim-polymyxin b (POLYTRIM) ophthalmic solution   0   No current facility-administered medications for this visit.     ALLERGIES: Doxycycline  Family History  Problem Relation Age of Onset  . Breast cancer Paternal Grandmother     and ovarian  . Hypertension Mother   . Diabetes Paternal Grandmother     Social History   Social History  . Marital Status: Married    Spouse Name: N/A  . Number of Children: N/A  . Years of Education: N/A   Occupational History  . Not on file.   Social History Main Topics  . Smoking status: Light Tobacco Smoker  . Smokeless tobacco: Never Used     Comment: 2-3x'sper week  . Alcohol Use: 0.5 - 1.0 oz/week    1-2 drink(s) per week     Comment: twice a month  . Drug Use: No  . Sexual Activity:    Partners: Male    Birth Control/ Protection: IUD     Comment: skyla   Other Topics Concern  .  Not on file   Social History Narrative    Review of Systems  Gastrointestinal: Positive for abdominal pain.  Genitourinary:       Painful periods   All other systems reviewed and are negative.   PHYSICAL EXAMINATION:    BP 118/80 mmHg  Pulse 80  Resp 16  Wt 161 lb (73.029 kg)  LMP 08/08/2015    General appearance: alert, cooperative and appears stated age Abdomen: soft, mildly tender in the LLQ, no rebound, no guarding, no masses  Pelvic: External genitalia:  no lesions              Urethra:  normal appearing urethra with no masses, tenderness or lesions              Bartholins and Skenes: normal                 Vagina: normal appearing vagina, increase in thick white vaginal d/c (not symptomatic)              Cervix: no lesions and no CMT , IUD string less than 1 cm              Bimanual Exam:  Uterus:  normal size, contour, position, consistency, mobility,  non-tender              Adnexa: Tender with some fullness in the left adnexa, normal right adnexa              Rectovaginal: Yes.  .  Confirms.              Anus:  normal sphincter tone, no lesions  Chaperone was present for exam.  Urine dip with 2+ blood, otherwise negative  ASSESSMENT LLQ abdominal pain, has a skyla IUD Suspect ovarian cyst  Hematuria  PLAN Ultrasound today Percocet for pain Further plan after the ultrasound returns Urine for ua, c&s   An After Visit Summary was printed and given to the patient.

## 2015-08-24 NOTE — Telephone Encounter (Signed)
Spoke with patient. Advised of results as seen below. Advised Dr.Jertson recommends follow up PUS in 6-12 weeks. Will need to notify our office if symptoms worsen. Patient is agreeable. PUS scheduled for 10/06/2015 at 1:30 pm with 2 pm consult with Dr.Jertson. Patient is agreeable to date and time. Orders will need precert.  Cc: Theresia Lo  Dr.Jertson, agree with plan?

## 2015-08-25 LAB — URINALYSIS, MICROSCOPIC ONLY
BACTERIA UA: NONE SEEN [HPF]
CASTS: NONE SEEN [LPF]
Crystals: NONE SEEN [HPF]
RBC / HPF: NONE SEEN RBC/HPF (ref <=2)
WBC, UA: NONE SEEN WBC/HPF (ref <=5)
Yeast: NONE SEEN [HPF]

## 2015-08-25 NOTE — Telephone Encounter (Signed)
Sounds good. I realize she is a patient of Dr Sanjuan Dame and should f/u with her.

## 2015-08-25 NOTE — Telephone Encounter (Signed)
Spoke with patient. Patient states she feels "much better" today than yesterday. Patient was able to go to work today. "I have taken ibuprofen and have only had a couple of pains today." Denies any worsening of pain. PUS with Dr Talbert Nan cancelled. PUS scheduled with Dr.Miller for 11/10 at 12:30 pm with 12:45 pm consult with Dr.Miller. Patient is agreeable to date and time. Follow up for IUD check cancelled for 09/13/2015 as Dr.Jertson states it is okay since IUD was checked with PUS on 9/26. Patient is agreeable and verbalizes understanding. Will continue to monitor symptoms and return call if symptoms worsen.  Routing to provider for final review. Patient agreeable to disposition. Will close encounter.

## 2015-08-26 LAB — URINE CULTURE
Colony Count: NO GROWTH
Organism ID, Bacteria: NO GROWTH

## 2015-09-11 ENCOUNTER — Ambulatory Visit: Payer: Managed Care, Other (non HMO) | Admitting: Obstetrics & Gynecology

## 2015-09-21 ENCOUNTER — Telehealth: Payer: Self-pay | Admitting: Obstetrics & Gynecology

## 2015-09-21 NOTE — Telephone Encounter (Signed)
Patient returning call.

## 2015-09-21 NOTE — Telephone Encounter (Signed)
Reviewed benefit. Patient agreeable. Reviewed arrival date/time. Patient agreeable. Ok to close.

## 2015-09-21 NOTE — Telephone Encounter (Signed)
Spoke with patient regarding benefits for PUS scheduled 10/08/15. Left voicemail for return call.

## 2015-10-06 ENCOUNTER — Other Ambulatory Visit: Payer: Managed Care, Other (non HMO)

## 2015-10-06 ENCOUNTER — Other Ambulatory Visit: Payer: Managed Care, Other (non HMO) | Admitting: Obstetrics and Gynecology

## 2015-10-08 ENCOUNTER — Ambulatory Visit (INDEPENDENT_AMBULATORY_CARE_PROVIDER_SITE_OTHER): Payer: Managed Care, Other (non HMO) | Admitting: Obstetrics & Gynecology

## 2015-10-08 ENCOUNTER — Ambulatory Visit (INDEPENDENT_AMBULATORY_CARE_PROVIDER_SITE_OTHER): Payer: Managed Care, Other (non HMO)

## 2015-10-08 VITALS — BP 122/78 | HR 64 | Resp 16 | Wt 163.0 lb

## 2015-10-08 DIAGNOSIS — N83202 Unspecified ovarian cyst, left side: Secondary | ICD-10-CM | POA: Diagnosis not present

## 2015-10-08 NOTE — Progress Notes (Signed)
44 y.o. Heather Acevedo here for a pelvic ultrasound to recheck ovary.  Pt seen 08/24/15 with Dr. Talbert Nan for actue LLQ pain.  PUS ordered at [facility] with pt today.  Actual images reviewed.  39 x 22 x 68mm left cyst with thick septation and internal flow noted.  Repeat PUS recommended and she is here for this today.  Feels cyst will be gone as pain has completely resolved.    No LMP recorded.  Sexually active:  yes  Contraception: Skyla IUD  FINDINGS: UTERUS: 7.7 x 5.2 x 3.8cm EMS: 3.27mm with IUD in correct location ADNEXA:   Left ovary 3.1 x 3.8 x 1.5cm with 3.1 x 3.2 x 3.5cm.  Cyst is avascular and thin walled.  Septation from prior exam is not present.  Size is minimally changed.      Right ovary 2.4 x 2.1 x [id number] with 40mm follicle CUL DE SAC: minimal free fluid  Reviewed findings with pt.  Cyst is different today as no septation is present.  It does appear simple and benign.  I'd like to give this some more time to see if will resolve before proceeding with surgical excision.  Pt is in agreement but knows if pain returns, she needs to call and be seen.  Pt has AEX scheduled in a couple of weeks.  Will change appts to the same day.  Assessment:  H/O LLQ pain that has resolved.  3.5cm simple cyst, now without a sepatation present on PUS. IUD in correct location  Plan:  Repeat PUS 6 weeks.  Plan AEX for same day.  ~15 minutes spent with patient >50% of time was in face to face discussion of above.

## 2015-10-08 NOTE — Progress Notes (Signed)
Scheduled patient while in office for follow up PUS on 11/26/2015 at 1 pm with 1:30 pm consult and aex with Dr.Miller (per SM).

## 2015-10-09 ENCOUNTER — Encounter: Payer: Self-pay | Admitting: Obstetrics & Gynecology

## 2015-10-26 ENCOUNTER — Encounter: Payer: Self-pay | Admitting: Internal Medicine

## 2015-11-12 ENCOUNTER — Ambulatory Visit: Payer: Managed Care, Other (non HMO) | Admitting: Obstetrics & Gynecology

## 2015-11-13 ENCOUNTER — Telehealth: Payer: Self-pay | Admitting: Obstetrics & Gynecology

## 2015-11-13 NOTE — Telephone Encounter (Signed)
Spoke with patient and reviewed benefit for ultrasound scheduled 11/26/15 with Dr Sabra Heck. Patient agreeable. Patient agreeable to arrival date/time. Patient agreeable to 72 hour cancellation policy with [id number] fee. No further questions. Ok to close.

## 2015-11-26 ENCOUNTER — Ambulatory Visit (INDEPENDENT_AMBULATORY_CARE_PROVIDER_SITE_OTHER): Payer: Managed Care, Other (non HMO)

## 2015-11-26 ENCOUNTER — Ambulatory Visit (INDEPENDENT_AMBULATORY_CARE_PROVIDER_SITE_OTHER): Payer: Managed Care, Other (non HMO) | Admitting: Obstetrics & Gynecology

## 2015-11-26 ENCOUNTER — Encounter: Payer: Self-pay | Admitting: Obstetrics & Gynecology

## 2015-11-26 VITALS — BP 120/72 | HR 74 | Resp 16 | Ht 67.0 in | Wt 166.0 lb

## 2015-11-26 DIAGNOSIS — N83202 Unspecified ovarian cyst, left side: Secondary | ICD-10-CM

## 2015-11-26 DIAGNOSIS — N83201 Unspecified ovarian cyst, right side: Secondary | ICD-10-CM | POA: Diagnosis not present

## 2015-11-26 DIAGNOSIS — Z975 Presence of (intrauterine) contraceptive device: Secondary | ICD-10-CM | POA: Diagnosis not present

## 2015-11-26 DIAGNOSIS — Z01419 Encounter for gynecological examination (general) (routine) without abnormal findings: Secondary | ICD-10-CM | POA: Diagnosis not present

## 2015-11-26 DIAGNOSIS — N898 Other specified noninflammatory disorders of vagina: Secondary | ICD-10-CM | POA: Diagnosis not present

## 2015-11-26 DIAGNOSIS — N938 Other specified abnormal uterine and vaginal bleeding: Secondary | ICD-10-CM

## 2015-11-26 DIAGNOSIS — Z23 Encounter for immunization: Secondary | ICD-10-CM | POA: Diagnosis not present

## 2015-11-26 MED ORDER — FLUOXETINE HCL 20 MG PO CAPS: ORAL_CAPSULE | ORAL | Status: DC

## 2015-11-26 MED ORDER — SUMATRIPTAN SUCCINATE 100 MG PO TABS: 100.0000 mg | ORAL_TABLET | ORAL | Status: DC | PRN

## 2015-11-26 NOTE — Progress Notes (Addendum)
44 y.o. [id number] MarriedAfrican AmericanF here for annual exam.  Doing well.  Had ultrasound for follow-up of almost 4cm left ovarian cyst.  Pt reports pain is gone.  Heather Acevedo is still having intermittent spotting and no real cycles.  The spotting Heather Acevedo finds annoying but Heather Acevedo continues to smoke occassionally and knows Heather Acevedo cannot be on estrogen containing contraception.    Has gotten another job promotion that will put her permanently in [place] in [place] right now and will probably not move to [facility].    Requests GC/Chl testing due to pt feeling sensation of increased vaginal discharge.  No odor.  No sex partner change.  No concerns about spouse.  "Just need to know".  Reviewed ultrasound findings with pt. Uterus 7.4 x 4.7 x 3.8cm Endometrium 3.79mm with IUD in correct location Left ovary: 2.3 x 2.1 x [id number] with 70mm follicle.  Large cyst from prior ultrasound is gone. Right ovary 4.5 x 2.6 x 2.7cm with 2.5cm and [id number] cyst/follicles that are smooth walled, avascular, and echofree Cul de sac: mild amt clear fluid noted    Patient's last menstrual period was 11/07/2015 (approximate).          Sexually active: Yes.    The current method of family planning is IUD.    Exercising: Yes.    Walking Smoker:  yes  Health Maintenance: Pap:  09/16/14 Neg.  History of abnormal Pap:  Yes, LEEP 2011 MMG:  11/15/13 BIRADS1:Neg Colonoscopy:  Never BMD:   Never TDaP: will update today Screening Labs: normal 8/14, Hb today: not doing today , Urine today: not done    reports that Heather Acevedo has been smoking.  Heather Acevedo has never used smokeless tobacco. Heather Acevedo reports that Heather Acevedo drinks about 0.5 - 1.0 oz of alcohol per week. Heather Acevedo reports that Heather Acevedo does not use illicit drugs.  Past Medical History  Diagnosis Date  . Vertigo 4/12  . Migraine   . Elevated LFTs   . Sciatic nerve injury     strain  . Abnormal Pap smear     LEEP CIN I and II, margins negative    Past Surgical History  Procedure Laterality Date  . Heel  spur surgery Left     4/09  . Cervical biopsy  w/ loop electrode excision  10/11    CIN I & II- Margins Negative    Current Outpatient Prescriptions  Medication Sig Dispense Refill  . Acetaminophen (TYLENOL EXTRA STRENGTH PO) Take by mouth as needed.    . Aspirin-Acetaminophen-Caffeine (EXCEDRIN MIGRAINE PO) Take by mouth as needed.     Marland Kitchen FLUoxetine (PROZAC) 20 MG capsule TAKE 1 CAPSULE (20 MG TOTAL) BY MOUTH DAILY. 30 capsule 5  . Levonorgestrel (SKYLA) 13.5 MG IUD by Intrauterine route.    . Multiple Vitamins-Minerals (MULTIVITAMIN PO) Take by mouth. Ulta Mega MVI    . SUMAtriptan (IMITREX) 100 MG tablet Take 1 tablet (100 mg total) by mouth every 2 (two) hours as needed for migraine. 9 tablet 5   No current facility-administered medications for this visit.    Family History  Problem Relation Age of Onset  . Breast cancer Paternal Grandmother     and ovarian  . Hypertension Mother   . Diabetes Paternal Grandmother     ROS:  Pertinent items are noted in HPI.  Otherwise, a comprehensive ROS was negative.  Exam:   BP 120/72 mmHg  Pulse 74  Resp 16  Ht 5\' 7"  (1.702 m)  Wt 166 lb (75.297 kg)  BMI 25.99 kg/m2  LMP 11/07/2015 (Approximate)  Weight change: -5#   Height: 5\' 7"  (170.2 cm)  Ht Readings from Last 3 Encounters:  11/26/15 5\' 7"  (1.702 m)  09/16/14 5' 6.5" (1.689 m)  08/22/13 5' 6.25" (1.683 m)    General appearance: alert, cooperative and appears stated age Head: Normocephalic, without obvious abnormality, atraumatic Neck: no adenopathy, supple, symmetrical, trachea midline and thyroid normal to inspection and palpation Lungs: clear to auscultation bilaterally Breasts: normal appearance, no masses or tenderness Heart: regular rate and rhythm Abdomen: soft, non-tender; bowel sounds normal; no masses,  no organomegaly Extremities: extremities normal, atraumatic, no cyanosis or edema Skin: Skin color, texture, turgor normal. No rashes or lesions Lymph nodes:  Cervical, supraclavicular, and axillary nodes normal. No abnormal inguinal nodes palpated Neurologic: Grossly normal   Pelvic: External genitalia:  no lesions              Urethra:  normal appearing urethra with no masses, tenderness or lesions              Bartholins and Skenes: normal                 Vagina: normal appearing vagina with normal color and discharge, no lesions              Cervix: no lesions, cannot see IUD string today              Pap taken: Yes.   Bimanual Exam:  Uterus:  Normal size and shape, no masses, NT              Adnexa: no mass, fullness, tenderness               Rectovaginal: Confirms               Anus:  normal sphincter tone, no lesions  Chaperone was present for exam.  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  Persistent DUB with [name] IUD H/o 4cm left ovarian cyst, resolved with PUS today  P: Mammogram will be scheduled for pt in [place]  pap smear today. Needs yearly. Will do HR HPV testing today.  Pt also requests GC/Chl testing which was done today Pt continues to desire Skyla IUD for contraception.  Does not want BTL and knows Heather Acevedo cannot take estrogen containing products.  Also, Heather Acevedo declines other progesterone methods. Tdap update today Rx for Imitrex 100mg  at headache onset. Repeat 2 hours. Max 200mg /24 hours. #9/13 Prozac 20mg  daily.  #30/13RF return annually or prn  ~In additional to AEX, additional 15 minutes spent with patient discussing ultrasound findings and persistent DUB related to [facility] IUD use as well as options for treatment.

## 2015-11-26 NOTE — Progress Notes (Signed)
Scheduled patient while in office for bilateral screening mammogram at [facility] for January 9th at 7:30 am.

## 2015-11-27 NOTE — Addendum Note (Signed)
Addended by: [name] on: 11/27/2015 06:17 PM   Modules accepted: Miquel Dunn

## 2015-12-01 LAB — IPS N GONORRHOEA AND CHLAMYDIA BY PCR

## 2015-12-01 LAB — IPS PAP TEST WITH HPV

## 2015-12-16 IMAGING — MG MM DIGITAL SCREENING BILAT
8 series · 8 of 24 positions shown · non-contrast
Comparison: None.

CLINICAL DATA: Screening.

EXAM:
DIGITAL SCREENING BILATERAL MAMMOGRAM WITH CAD
DIGITAL BREAST TOMOSYNTHESIS
Digital breast tomosynthesis images are acquired in two projections.
These images are reviewed in combination with the digital mammogram,
confirming the findings below.

[L MLO]
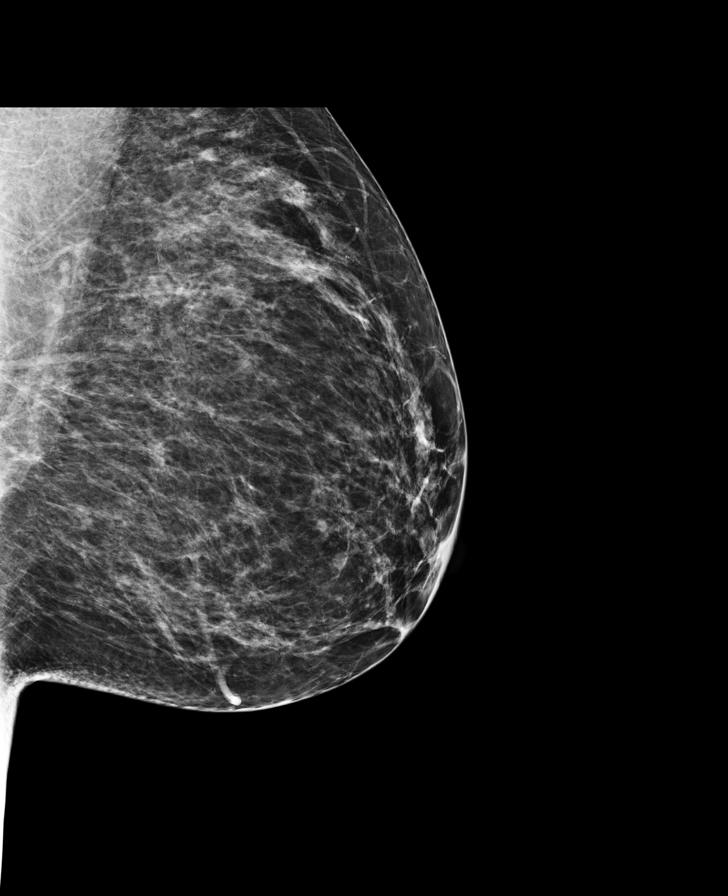

[L CC]
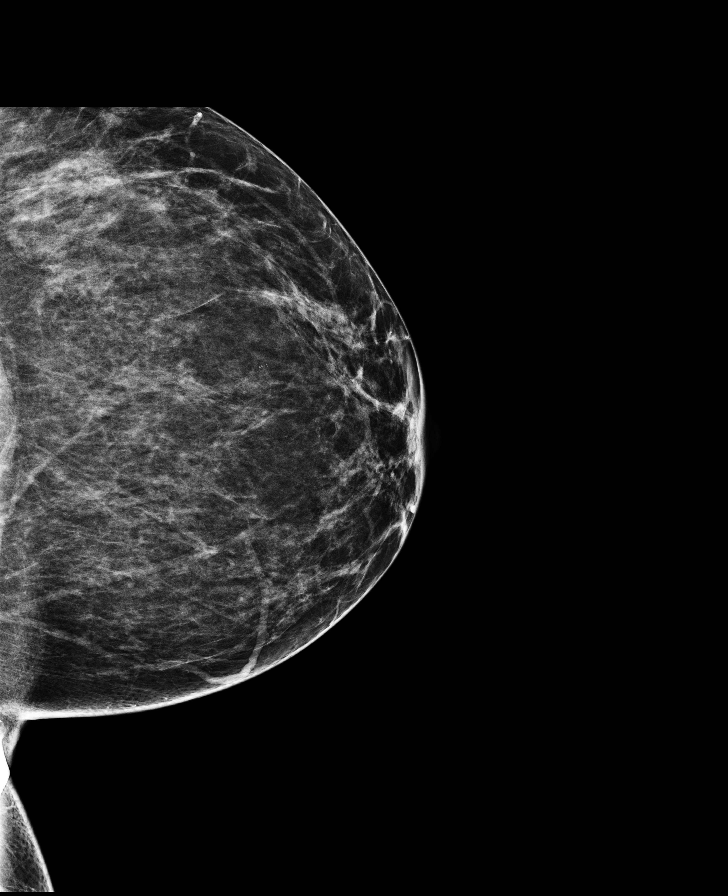

[R CC]
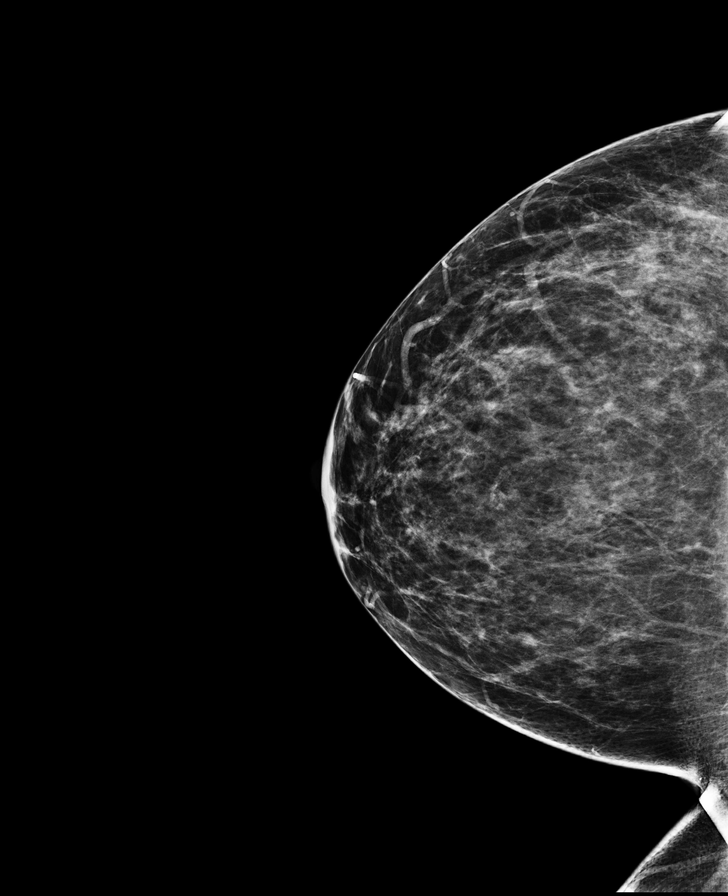

[R MLO]
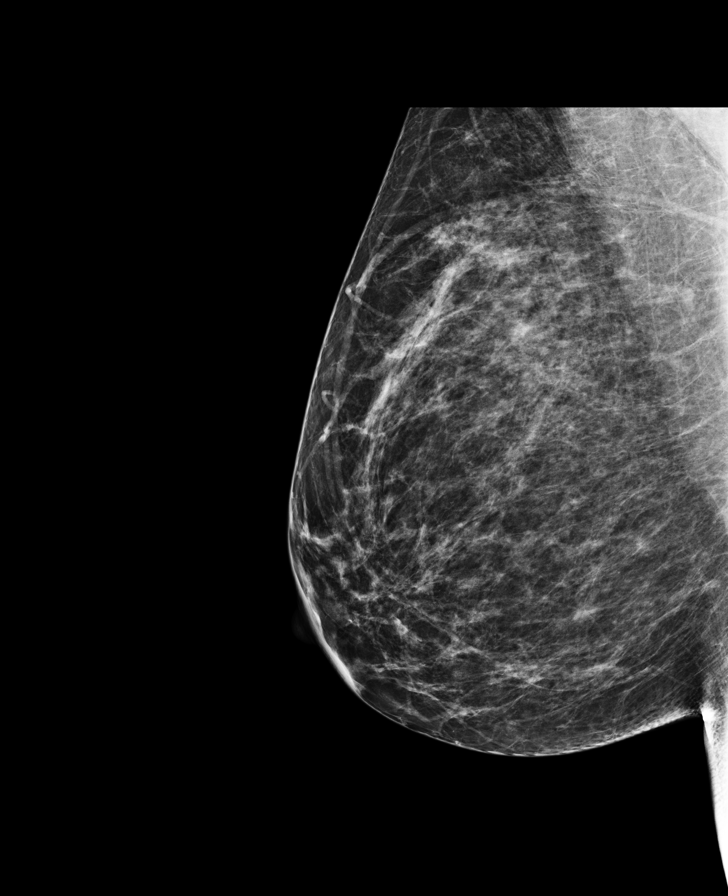

[L CC tomo · tomo slice 41/81.0]
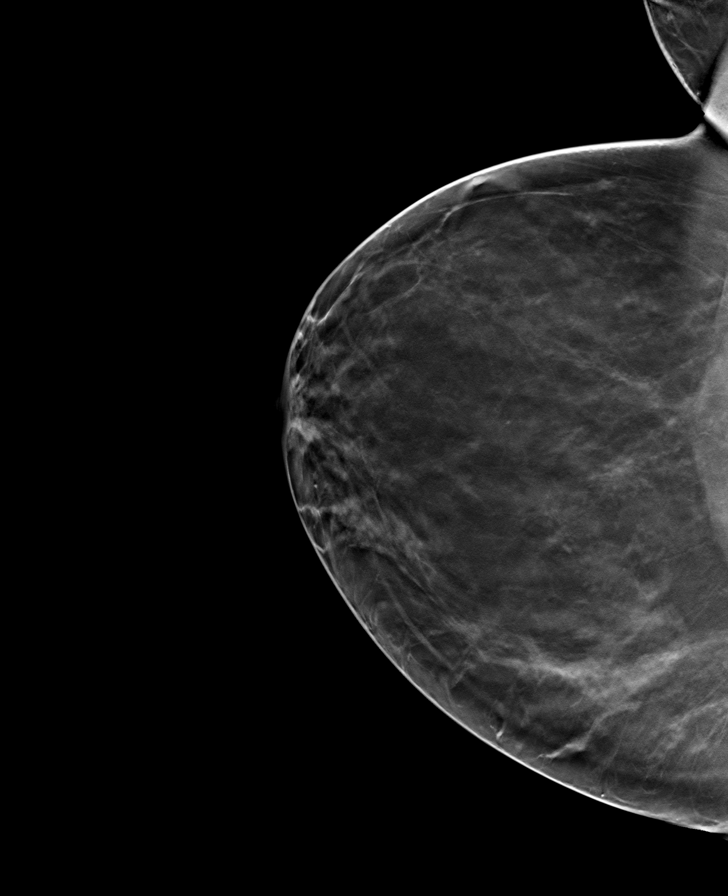

[R MLO tomo · tomo slice 41/81.0]
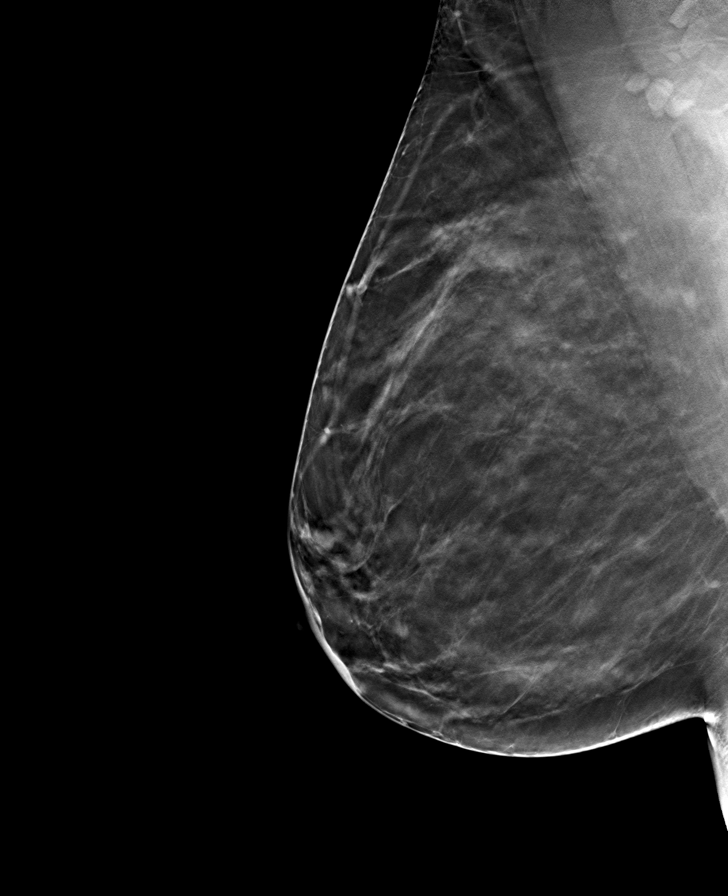

[L MLO tomo · tomo slice 41/81.0]
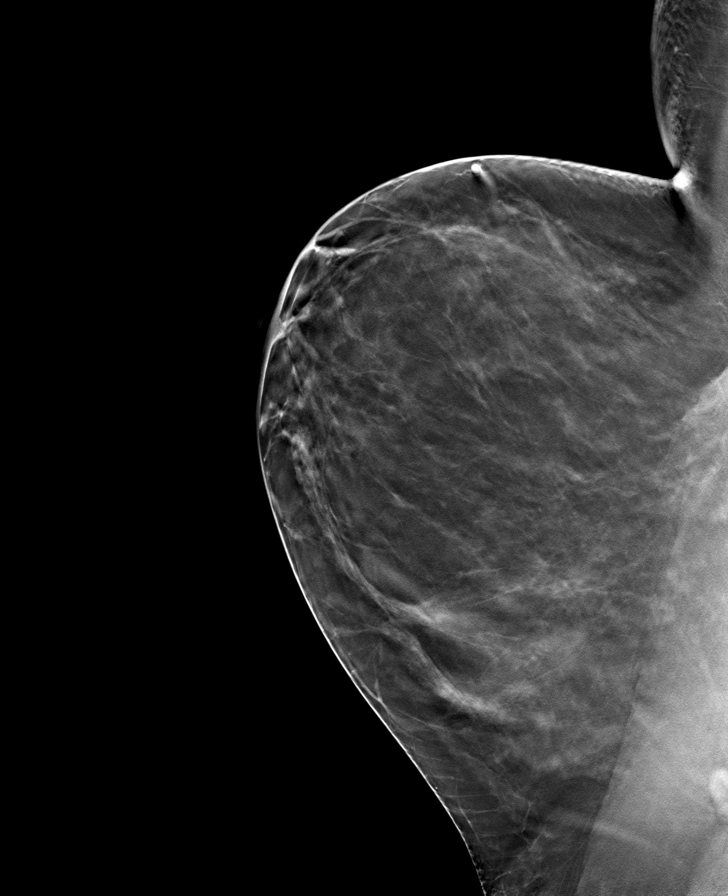

[R CC tomo · tomo slice 39/77.0]
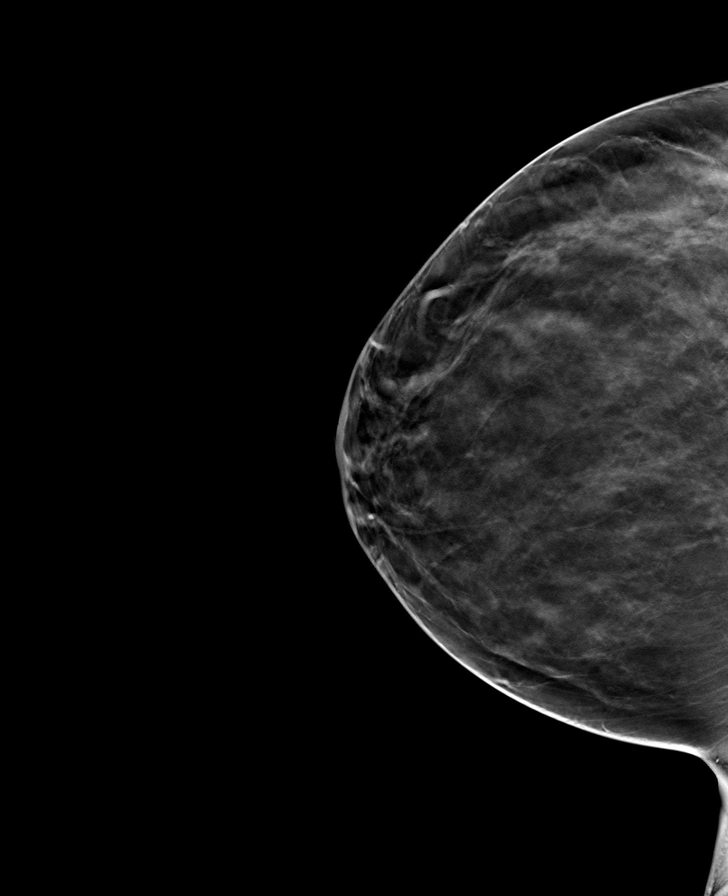

[8 of 24 positions shown; findings below may reference images not displayed]

ACR Breast Density Category b: There are scattered areas of
fibroglandular density.
FINDINGS: There are no findings suspicious for malignancy. Images were
processed with CAD.
IMPRESSION: No mammographic evidence of malignancy. A result letter of this
screening mammogram will be mailed directly to the patient.

RECOMMENDATION:
Screening mammogram in one year. (Code:SP-Q-G36)

BI-RADS CATEGORY  1: Negative

## 2016-12-01 ENCOUNTER — Ambulatory Visit: Payer: Managed Care, Other (non HMO) | Admitting: Obstetrics & Gynecology

## 2016-12-05 ENCOUNTER — Encounter: Payer: Self-pay | Admitting: Obstetrics & Gynecology

## 2016-12-05 ENCOUNTER — Ambulatory Visit (INDEPENDENT_AMBULATORY_CARE_PROVIDER_SITE_OTHER): Payer: 59 | Admitting: Obstetrics & Gynecology

## 2016-12-05 VITALS — BP 122/86 | HR 64 | Resp 12 | Ht 66.5 in | Wt 153.0 lb

## 2016-12-05 DIAGNOSIS — N898 Other specified noninflammatory disorders of vagina: Secondary | ICD-10-CM | POA: Diagnosis not present

## 2016-12-05 DIAGNOSIS — Z124 Encounter for screening for malignant neoplasm of cervix: Secondary | ICD-10-CM

## 2016-12-05 DIAGNOSIS — Z01419 Encounter for gynecological examination (general) (routine) without abnormal findings: Secondary | ICD-10-CM

## 2016-12-05 DIAGNOSIS — Z202 Contact with and (suspected) exposure to infections with a predominantly sexual mode of transmission: Secondary | ICD-10-CM

## 2016-12-05 DIAGNOSIS — Z0000 Encounter for general adult medical examination without abnormal findings: Secondary | ICD-10-CM | POA: Diagnosis not present

## 2016-12-05 DIAGNOSIS — E559 Vitamin D deficiency, unspecified: Secondary | ICD-10-CM

## 2016-12-05 LAB — CBC
HEMATOCRIT: 41.7 % (ref 35.0–45.0)
HEMOGLOBIN: 13.6 g/dL (ref 11.7–15.5)
MCH: 33.7 pg — AB (ref 27.0–33.0)
MCHC: 32.6 g/dL (ref 32.0–36.0)
MCV: 103.2 fL — ABNORMAL HIGH (ref 80.0–100.0)
MPV: 9.7 fL (ref 7.5–12.5)
Platelets: 251 10*3/uL (ref 140–400)
RBC: 4.04 MIL/uL (ref 3.80–5.10)
RDW: 13.2 % (ref 11.0–15.0)
WBC: 4 10*3/uL (ref 3.8–10.8)

## 2016-12-05 LAB — TSH: TSH: 1.85 m[IU]/L

## 2016-12-05 LAB — LIPID PANEL
CHOL/HDL RATIO: 2.1 ratio (ref <5.0)
Cholesterol: 166 mg/dL (ref <200)
HDL: 80 mg/dL (ref >50)
LDL CALC: 76 mg/dL (ref <100)
TRIGLYCERIDES: 49 mg/dL (ref <150)
VLDL: 10 mg/dL (ref <30)

## 2016-12-05 LAB — POCT URINALYSIS DIPSTICK
Bilirubin, UA: NEGATIVE
Glucose, UA: NEGATIVE
KETONES UA: NEGATIVE
Leukocytes, UA: NEGATIVE
Nitrite, UA: NEGATIVE
PROTEIN UA: NEGATIVE
UROBILINOGEN UA: NEGATIVE
pH, UA: 7

## 2016-12-05 LAB — COMPREHENSIVE METABOLIC PANEL
ALBUMIN: 4.3 g/dL (ref 3.6–5.1)
ALT: 18 U/L (ref 6–29)
AST: 19 U/L (ref 10–30)
Alkaline Phosphatase: 61 U/L (ref 33–115)
BILIRUBIN TOTAL: 0.5 mg/dL (ref 0.2–1.2)
BUN: 14 mg/dL (ref 7–25)
CALCIUM: 9.9 mg/dL (ref 8.6–10.2)
CHLORIDE: 103 mmol/L (ref 98–110)
CO2: 29 mmol/L (ref 20–31)
Creat: 0.7 mg/dL (ref 0.50–1.10)
GLUCOSE: 90 mg/dL (ref 65–99)
Potassium: 4.3 mmol/L (ref 3.5–5.3)
Sodium: 140 mmol/L (ref 135–146)
Total Protein: 8 g/dL (ref 6.1–8.1)

## 2016-12-05 MED ORDER — FLUOXETINE HCL 20 MG PO CAPS: 20.0000 mg | ORAL_CAPSULE | Freq: Two times a day (BID) | ORAL | 13 refills | Status: DC

## 2016-12-05 MED ORDER — SUMATRIPTAN SUCCINATE 100 MG PO TABS: 100.0000 mg | ORAL_TABLET | ORAL | 13 refills | Status: DC | PRN

## 2016-12-05 NOTE — Progress Notes (Signed)
46 y.o. [id number] MarriedAfrican AmericanF here for annual exam.  Doing well.  Bleeding is just minimal.  Doesn't really cycle, just has spotting.  Has a couple of questions.  Feels like she is having a lot of discharge.  Would like this evaluated.    Patient's last menstrual period was 11/25/2016.          Sexually active: Yes.    The current method of family planning is IUD.    Exercising: Yes.    cardio Smoker:  yes  Health Maintenance: Pap:  11/26/15 negative, HR HPV negative  History of abnormal Pap:  yes MMG:  12/14/15 BIRADS 2 benign  Colonoscopy:  None  BMD:   none TDaP:  11/26/15  Pneumonia vaccine(s):  never Zostavax:   never Hep C testing: not indicated  Screening Labs: fasting labs drawn today, Hb today: same, Urine today: trace RBC's   reports that she has been smoking.  She has never used smokeless tobacco. She reports that she drinks about 0.5 - 1.0 oz of alcohol per week . She reports that she does not use drugs.  Past Medical History:  Diagnosis Date  . 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 Elevated LFTs   . Migraine   . Sciatic nerve injury    strain  . Vertigo 4/12    Past Surgical History:  Procedure Laterality Date  . CERVICAL BIOPSY  W/ LOOP ELECTRODE EXCISION  10/11   CIN I & II- Margins Negative  . HEEL SPUR SURGERY Left    4/09    Current Outpatient Prescriptions  Medication Sig Dispense Refill  . Acetaminophen (TYLENOL EXTRA STRENGTH PO) Take by mouth as needed.    . Aspirin-Acetaminophen-Caffeine (EXCEDRIN MIGRAINE PO) Take by mouth as needed.     Marland Kitchen FLUoxetine (PROZAC) 20 MG capsule TAKE 1 CAPSULE (20 MG TOTAL) BY MOUTH DAILY. 30 capsule 13  . Levonorgestrel (SKYLA) 13.5 MG IUD by Intrauterine route.    . Multiple Vitamins-Minerals (MULTIVITAMIN PO) Take by mouth. Ulta Mega MVI    . SUMAtriptan (IMITREX) 100 MG tablet Take 1 tablet (100 mg total) by mouth every 2 (two) hours as needed for migraine. 9 tablet 13   No  current facility-administered medications for this visit.     Family History  Problem Relation Age of Onset  . Breast cancer Paternal Grandmother     and ovarian  . Hypertension Mother   . Diabetes Paternal Grandmother     ROS:  Pertinent items are noted in HPI.  Otherwise, a comprehensive ROS was negative.  Exam:   BP 122/86 (BP Location: Right Arm, Patient Position: Sitting, Cuff Size: Normal)   Pulse 64   Resp 12   Ht 5' 6.5" (1.689 m)   Wt 153 lb (69.4 kg)   LMP 11/25/2016   BMI 24.32 kg/m   Weight change:-13#   Height: 5' 6.5" (168.9 cm)  Ht Readings from Last 3 Encounters:  12/05/16 5' 6.5" (1.689 m)  11/26/15 5\' 7"  (1.702 m)  09/16/14 5' 6.5" (1.689 m)   General appearance: alert, cooperative and appears stated age Head: Normocephalic, without obvious abnormality, atraumatic Neck: no adenopathy, supple, symmetrical, trachea midline and thyroid normal to inspection and palpation Lungs: clear to auscultation bilaterally Breasts: normal appearance, no masses or tenderness Heart: regular rate and rhythm Abdomen: soft, non-tender; bowel sounds normal; no masses,  no organomegaly Extremities: extremities normal, atraumatic, no cyanosis or edema Skin: Skin color, texture, turgor  normal. No rashes or lesions Lymph nodes: Cervical, supraclavicular, and axillary nodes normal. No abnormal inguinal nodes palpated Neurologic: Grossly normal   Pelvic: External genitalia:  no lesions              Urethra:  normal appearing urethra with no masses, tenderness or lesions              Bartholins and Skenes: normal                 Vagina: normal appearing vagina with normal color and discharge, no lesions              Cervix: no lesions              Pap taken: Yes.   Bimanual Exam:  Uterus:  normal size, contour, position, consistency, mobility, non-tender              Adnexa: normal adnexa and no mass, fullness, tenderness               Rectovaginal: Confirms               Anus:   normal sphincter tone, no lesions  Chaperone was present for exam.  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  DUB much improved with current IUD  P:  Mammogram will be planned for next year.    pap smear today.  Neg pap and HR HPV 12/16. GC/Chl today HIV, RPR, Hep B Affirm testing CBC, CMP, TSH, Vit D, lipids Rx for Imitrex 100mg  at headache onset. Repeat 2 hours. Max 200mg /24 hours. #9/13 Prozac 20mg  daily.  #30/13RF return annually or prn

## 2016-12-06 ENCOUNTER — Other Ambulatory Visit: Payer: Self-pay | Admitting: Obstetrics & Gynecology

## 2016-12-06 LAB — STD PANEL
HEP B S AG: NEGATIVE
HIV: NONREACTIVE

## 2016-12-06 LAB — IPS PAP TEST WITH REFLEX TO HPV

## 2016-12-06 LAB — VITAMIN D 25 HYDROXY (VIT D DEFICIENCY, FRACTURES): VIT D 25 HYDROXY: 14 ng/mL — AB (ref 30–100)

## 2016-12-06 LAB — WET PREP BY MOLECULAR PROBE
Candida species: NEGATIVE
GARDNERELLA VAGINALIS: POSITIVE — AB
TRICHOMONAS VAG: NEGATIVE

## 2016-12-06 LAB — IPS N GONORRHOEA AND CHLAMYDIA BY PCR

## 2016-12-06 NOTE — Addendum Note (Signed)
Addended by: [name] on: 12/06/2016 05:41 PM   Modules accepted: Orders

## 2016-12-08 ENCOUNTER — Telehealth: Payer: Self-pay | Admitting: *Deleted

## 2016-12-08 MED ORDER — VITAMIN D (ERGOCALCIFEROL) 1.25 MG (50000 UNIT) PO CAPS: 50000.0000 [IU] | ORAL_CAPSULE | ORAL | 0 refills | Status: DC

## 2016-12-08 MED ORDER — METRONIDAZOLE 500 MG PO TABS: 500.0000 mg | ORAL_TABLET | Freq: Two times a day (BID) | ORAL | 0 refills | Status: DC

## 2016-12-08 NOTE — Telephone Encounter (Signed)
Call to patient. All results reviewed with patient and she verbalized understanding. Pharmacy on file verified. Prescriptions for vitamin D ( 50,000 IU) and Flagyl (500 mg BID) sent to pharmacy. Instructions on use reviewed with patient, as well as Alcohol precautions while taking flagyl. Patient verbalized understanding. 3 month vitamin d recheck scheduled for 03/06/17 at [place].Future order present for vitamin d. Patient agreeable to date and time of appointment.    Routing to provider for final review. Patient agreeable to disposition. Will close encounter.

## 2016-12-08 NOTE — Telephone Encounter (Signed)
-----   Message from [name], MD sent at 12/06/2016  5:40 PM EST ----- Please call and let pt know her HIV, RPR, Hep B testing was negative.  Also, her GC and Chl were negative.  Her CBC, CMP, lipids, TSH were normal.  Her Vit D was very low at 14.  She should be on 50,0000 IU weekly and repeat in 12 weeks.  Rx has not been sent in.  Lab order has been placed.    Her pap smear was normal.  Her affirm testing showed BV.  She can treat this with Metrogel 0.75% vaginally, nightly x 5 nights or flagyl 500mg  bid x 7 days.  No ETOH if take the oral medication.  Again, this has not been sent into pharmacy.  Thanks.

## 2016-12-23 ENCOUNTER — Other Ambulatory Visit: Payer: Self-pay | Admitting: Obstetrics & Gynecology

## 2016-12-26 ENCOUNTER — Telehealth: Payer: Self-pay | Admitting: Obstetrics & Gynecology

## 2016-12-26 ENCOUNTER — Other Ambulatory Visit: Payer: Self-pay | Admitting: Obstetrics & Gynecology

## 2016-12-26 MED ORDER — FLUOXETINE HCL 20 MG PO CAPS: 20.0000 mg | ORAL_CAPSULE | Freq: Two times a day (BID) | ORAL | 13 refills | Status: DC

## 2016-12-26 NOTE — Telephone Encounter (Signed)
Patient is calling because her refill for Fluoxetine was not sent to her pharmacy. She is using [facility] in [place].

## 2016-12-26 NOTE — Telephone Encounter (Signed)
Medication refill request: prozac  Last AEX:  12/05/16 Next AEX: 03/01/18 SM  Last MMG (if hormonal medication request): 12/14/15 BIRADS2:Benign  Refill authorized: 12/05/16 #60/13R. Has two different directions  Called pharmacy. Unable to Verify Rx 20mg  daily #30tabs/13R  as prescribed by [facility] 12/05/16.  They want a new Rx sent to pharmacy

## 2016-12-26 NOTE — Telephone Encounter (Signed)
Rx edited and sent through as 20mg  BID.  Pt is going to titrate up dosing so needed the 20mg  tabs so could increase to 40mg  and see how she does.  Please let her know it was sent through the day she was here but was confusing for the pharmacy so directions have been clarified.  Also, can you let her know the pharmacy never let us know there was an issue.  Thanks.

## 2016-12-27 NOTE — Telephone Encounter (Signed)
Patient notified that rx was sent to pharmacy. Pt was very appreciative.

## 2017-02-28 ENCOUNTER — Other Ambulatory Visit: Payer: Self-pay | Admitting: Obstetrics & Gynecology

## 2017-02-28 NOTE — Telephone Encounter (Signed)
Medication refill request: Vitamin D  Last AEX:  12-05-16  Next AEX: 03-01-18  Last MMG (if hormonal medication request): 12-14-15 WNL  Refill authorized: please advise

## 2017-03-01 NOTE — Telephone Encounter (Signed)
Please call pt and see if she's had a repeat Vit D.  She lives in [place].  Her Vit D was low at AEX and she was started on supplementation but she may not need the RX any more.  An order for repeat lab was placed previously.  Thanks.

## 2017-03-01 NOTE — Telephone Encounter (Signed)
Thanks for the update.  Will cancel RF for now.

## 2017-03-01 NOTE — Telephone Encounter (Signed)
Spoke with patient and she is scheduled for Vit D recheck on Monday. She is going to see if there is a [facility] near her in [place] instead of driving here. She will let us know.

## 2017-03-06 ENCOUNTER — Other Ambulatory Visit (INDEPENDENT_AMBULATORY_CARE_PROVIDER_SITE_OTHER): Payer: 59

## 2017-03-06 DIAGNOSIS — E559 Vitamin D deficiency, unspecified: Secondary | ICD-10-CM

## 2017-03-07 ENCOUNTER — Encounter: Payer: Self-pay | Admitting: Obstetrics & Gynecology

## 2017-03-07 LAB — VITAMIN D 25 HYDROXY (VIT D DEFICIENCY, FRACTURES): Vit D, 25-Hydroxy: 40 ng/mL (ref 30–100)

## 2017-03-08 ENCOUNTER — Telehealth: Payer: Self-pay | Admitting: *Deleted

## 2017-03-08 NOTE — Telephone Encounter (Signed)
See telephone encounter dated 03/08/17.

## 2017-03-08 NOTE — Telephone Encounter (Signed)
Spoke with patient. Patient states she is feeling like she was the last time she had bilateral ovarian cyst. Patient states pain is not as intense, feels uncomfortable when sitting. Patient reports pain as 5/10, bearable, still working. Patient states she has Skyla IUD in place, first cycle in awhile 2 weeks ago, lasted for 1 day, pain began after bleeding, reports spotting now. Patient denies nausea, vomiting, fever, chills, or urinary complaints. Patient reports pressure with bowel movements. Recommended OV for further evaluation, patient scheduled for 03/09/17 at 10am with Dr. Sabra Heck. Advised patient should pain worsen, fever develop, bleeding become heavy -changing pad q1-2 hours -seek care at local ER. Patient verbalizes understanding and is agreeable.  Routing to covering provider Dr. Quincy Simmonds for final review.Patient is agreeable to disposition. Will close encounter.  Cc: Dr. Sabra Heck    From Adam Phenix To [name], MD Sent 03/07/2017 6:31 PM  I think that I may have another ovarian cyst. Can an appointment be set up for me at a preferred location in the [place] area?

## 2017-03-09 ENCOUNTER — Ambulatory Visit (INDEPENDENT_AMBULATORY_CARE_PROVIDER_SITE_OTHER): Payer: 59 | Admitting: Obstetrics & Gynecology

## 2017-03-09 ENCOUNTER — Other Ambulatory Visit: Payer: Self-pay | Admitting: Obstetrics & Gynecology

## 2017-03-09 ENCOUNTER — Ambulatory Visit (INDEPENDENT_AMBULATORY_CARE_PROVIDER_SITE_OTHER): Payer: 59

## 2017-03-09 ENCOUNTER — Encounter: Payer: Self-pay | Admitting: Obstetrics & Gynecology

## 2017-03-09 VITALS — BP 110/80 | HR 76 | Resp 12 | Ht 66.5 in | Wt 160.4 lb

## 2017-03-09 DIAGNOSIS — R1031 Right lower quadrant pain: Secondary | ICD-10-CM | POA: Diagnosis not present

## 2017-03-09 DIAGNOSIS — R102 Pelvic and perineal pain: Secondary | ICD-10-CM

## 2017-03-09 DIAGNOSIS — N7011 Chronic salpingitis: Secondary | ICD-10-CM

## 2017-03-09 DIAGNOSIS — Z975 Presence of (intrauterine) contraceptive device: Secondary | ICD-10-CM | POA: Diagnosis not present

## 2017-03-09 DIAGNOSIS — N938 Other specified abnormal uterine and vaginal bleeding: Secondary | ICD-10-CM | POA: Diagnosis not present

## 2017-03-09 LAB — POCT URINALYSIS DIPSTICK
Bilirubin, UA: NEGATIVE
GLUCOSE UA: NEGATIVE
Ketones, UA: NEGATIVE
LEUKOCYTES UA: NEGATIVE
Nitrite, UA: NEGATIVE
PROTEIN UA: NEGATIVE
UROBILINOGEN UA: 0.2 U/dL
pH, UA: 6.5 (ref 5.0–8.0)

## 2017-03-09 LAB — POCT URINE PREGNANCY: Preg Test, Ur: NEGATIVE

## 2017-03-09 MED ORDER — NAPROXEN 500 MG PO TABS: 500.0000 mg | ORAL_TABLET | Freq: Two times a day (BID) | ORAL | 0 refills | Status: DC

## 2017-03-09 NOTE — Progress Notes (Signed)
GYNECOLOGY  VISIT   HPI: 46 y.o. [id number] Married [place] American female here for complaint of pelvic pain that has been going on for about two weeks.  This has been accompanied by irregular bleeding and spotting.  Pt has experienced irregular bleeding and spotting off and on since having an IUD.  She's had a Skyla IUD for about five years.  She does smoker cigars on occasion so was switched from combination OCPs to POPs to IUD use.  Today she states she is just completely sick of the bleeding.  She is absolutely sure she does not want to have any children of her own.  Her husband has a teenage daughter.  He does not desire additional children either.   Pt states she knows this is abrupt but she's been considering this off and on for years.  I've known her a long time and when she makes a decision, she makes it.  She asks how quickly this can be performed.  D/w pt should proceed with additional evaluation today before proceeding with hysterectomy planning.  No STD concerns.  Had GC/Chl obtained in January due to recurrent discharge issues.  BV was present at that time.   Denies fever, urinary changes, nausea/vomiting/diarrhea or constipation.    GYNECOLOGIC HISTORY: Patient's last menstrual period was 03/03/2017. Contraception: Skyla IUD  Patient Active Problem List   Diagnosis Date Noted  . Fibroids, intramural 03/09/2015  . DUB (dysfunctional uterine bleeding) 09/16/2014  . IUD (intrauterine device) in place 09/16/2014    Past Medical History:  Diagnosis Date  . 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 Elevated LFTs   . Migraine   . Sciatic nerve injury    strain  . Vertigo 4/12    Past Surgical History:  Procedure Laterality Date  . CERVICAL BIOPSY  W/ LOOP ELECTRODE EXCISION  10/11   CIN I & II- Margins Negative  . HEEL SPUR SURGERY Left    4/09    MEDS:  Reviewed in EPIC and UTD  ALLERGIES: Doxycycline  Family History  Problem Relation Age of  Onset  . Breast cancer Paternal Grandmother     and ovarian  . Hypertension Mother   . Diabetes Paternal Grandmother     SH:  Married, occ smoking  Review of Systems  Gastrointestinal: Positive for abdominal pain (LLQ). Negative for blood in stool, constipation, diarrhea, heartburn, nausea and vomiting.  Genitourinary: Negative for dysuria, flank pain, frequency, hematuria and urgency.  Musculoskeletal: Negative.   All other systems reviewed and are negative.   PHYSICAL EXAMINATION:    BP 110/80 (BP Location: Right Arm, Patient Position: Sitting, Cuff Size: Normal)   Pulse 76   Resp 12   Ht 5' 6.5" (1.689 m)   Wt 160 lb 6.4 oz (72.8 kg)   LMP 03/03/2017   BMI 25.50 kg/m     General appearance: alert, cooperative and appears stated age CV:  Regular rate and rhythm Lungs:  clear to auscultation, no wheezes, rales or rhonchi, symmetric air entry Abdomen: soft, non-tender; bowel sounds normal; no masses,  no organomegaly  Pelvic: External genitalia:  no lesions              Urethra:  normal appearing urethra with no masses, tenderness or lesions              Bartholins and Skenes: normal                 Vagina:  normal appearing vagina with normal color and discharge, no lesions              Cervix: no lesions and IUD string noted              Bimanual Exam:  Uterus:  normal size, contour, position, consistency, mobility, non-tender              Adnexa: positive for tenderness on left, no masses or fullness              Rectovaginal: Yes.  .  Confirms.              Anus:  normal sphincter tone, no lesions  Chaperone was present for exam.  Ultrasound recommended and performed in office today. Uterus:  6.3 x 4.7 x 3.8cm with IUD in correct location Left ovary:  3.9 x 2.7 x 2.9cm with 3..1 x 2.0cm cyst, collapsing Right ovary 4.2 x 2.5 x 2.5cm with 2.6 x 1.9IF simple cyst/follicle Left probable hydrosalpinx measuring 3.0 x 1.1cm Cul de sac:  Mild free fluid noted  UPT  negative today  Assessment: LLQ pain for last two weeks, left hydrosalpinx noted Continued DUB with Skyla IUD, desires for definitive treatment  Plan: Naprosyn 500mg  bid, prn.  #30/0RF Pt desirous to proceed with hysterectomy.  Will have surgical scheduler call pt and make plans.   ~30 minutes spent with patient >50% of time was in face to face discussion of above.

## 2017-03-10 ENCOUNTER — Telehealth: Payer: Self-pay | Admitting: Obstetrics & Gynecology

## 2017-03-10 NOTE — Telephone Encounter (Signed)
Patient said "I was told that I would receive a call today to schedule my surgery." Patient is hoping to get an update today due to her work schedule.

## 2017-03-10 NOTE — Telephone Encounter (Signed)
Call to patient. Desires to proceed with hysterectomy on first available date. States she does not have any restrictions. Advised will check on scheduling and call her back.

## 2017-03-10 NOTE — Telephone Encounter (Signed)
Call back to patient. Discussed date option of 03-27-17 and patient agreeable. Advised will schedule and call back with confirmation, this will be beginning of next week. i

## 2017-03-11 ENCOUNTER — Other Ambulatory Visit: Payer: Self-pay | Admitting: Obstetrics & Gynecology

## 2017-03-13 NOTE — Telephone Encounter (Signed)
Call to patient. Confirmed surgery date of 03-27-17 at 60 at [facility]. Surgery instruction sheet reviewed and printed copy will be mailed to patient. Brief discussion on expectations before and after surgery reviewed.   Routing to provider for final review. Patient agreeable to disposition. Will close encounter.

## 2017-03-20 NOTE — Patient Instructions (Signed)
Your procedure is scheduled on:  Monday, March 27, 2017  Enter through the [employer] [facility] at:  6:00 AM  Pick up the phone at the desk and dial [phone].  Call this number if you have problems the morning of surgery: [phone].  Remember: Do NOT eat food or drink after:  Midnight Sunday  Take these medicines the morning of surgery with a SIP OF WATER:  Prozac  Stop ALL herbal medications at this time  Do NOT smoke the day of surgery.  Do NOT wear jewelry (body piercing), metal hair clips/bobby pins, make-up, or nail polish. Do NOT wear lotions, powders, or perfumes.  You may wear deodorant. Do NOT shave for 48 hours prior to surgery. Do NOT bring valuables to the hospital. Contacts, dentures, or bridgework may not be worn into surgery.  Leave suitcase in car.  After surgery it may be brought to your room.  For patients admitted to the hospital, checkout time is 11:00 AM the day of discharge.  Bring a copy of your healthcare power of attorney and living will documents.

## 2017-03-21 ENCOUNTER — Encounter: Payer: Self-pay | Admitting: Obstetrics & Gynecology

## 2017-03-21 ENCOUNTER — Encounter (HOSPITAL_COMMUNITY): Payer: Self-pay

## 2017-03-21 ENCOUNTER — Ambulatory Visit (INDEPENDENT_AMBULATORY_CARE_PROVIDER_SITE_OTHER): Payer: 59 | Admitting: Obstetrics & Gynecology

## 2017-03-21 ENCOUNTER — Encounter (HOSPITAL_COMMUNITY)
Admission: RE | Admit: 2017-03-21 | Discharge: 2017-03-21 | Disposition: A | Discharge status: Home / Self Care | Payer: 59 | Source: Ambulatory Visit | Attending: Obstetrics & Gynecology | Admitting: Obstetrics & Gynecology

## 2017-03-21 VITALS — BP 108/70 | HR 84 | Resp 16 | Wt 160.0 lb

## 2017-03-21 DIAGNOSIS — Z975 Presence of (intrauterine) contraceptive device: Secondary | ICD-10-CM | POA: Insufficient documentation

## 2017-03-21 DIAGNOSIS — Z0289 Encounter for other administrative examinations: Secondary | ICD-10-CM

## 2017-03-21 DIAGNOSIS — N938 Other specified abnormal uterine and vaginal bleeding: Secondary | ICD-10-CM | POA: Diagnosis not present

## 2017-03-21 DIAGNOSIS — Z01812 Encounter for preprocedural laboratory examination: Secondary | ICD-10-CM | POA: Insufficient documentation

## 2017-03-21 DIAGNOSIS — D251 Intramural leiomyoma of uterus: Secondary | ICD-10-CM | POA: Insufficient documentation

## 2017-03-21 LAB — CBC
HCT: 37 % (ref 36.0–46.0)
Hemoglobin: 12.4 g/dL (ref 12.0–15.0)
MCH: 33.3 pg (ref 26.0–34.0)
MCHC: 33.5 g/dL (ref 30.0–36.0)
MCV: 99.5 fL (ref 78.0–100.0)
Platelets: 229 10*3/uL (ref 150–400)
RBC: 3.72 MIL/uL — ABNORMAL LOW (ref 3.87–5.11)
RDW: 13.6 % (ref 11.5–15.5)
WBC: 3 10*3/uL — ABNORMAL LOW (ref 4.0–10.5)

## 2017-03-21 MED ORDER — METRONIDAZOLE 500 MG PO TABS: 500.0000 mg | ORAL_TABLET | Freq: Two times a day (BID) | ORAL | 0 refills | Status: DC

## 2017-03-21 MED ORDER — IBUPROFEN 800 MG PO TABS: 800.0000 mg | ORAL_TABLET | Freq: Three times a day (TID) | ORAL | 0 refills | Status: DC | PRN

## 2017-03-21 MED ORDER — OXYCODONE-ACETAMINOPHEN 5-325 MG PO TABS: 1.0000 | ORAL_TABLET | Freq: Four times a day (QID) | ORAL | 0 refills | Status: DC | PRN

## 2017-03-21 NOTE — Progress Notes (Signed)
Patient ID: Heather Acevedo, female   DOB: May 22, 1971, 46 y.o.   MRN: [id number]   46 y.o. G57P0020 Married [place] American female here for discussion of upcoming procedure.  TLH/bilateral salpingectomy/cystoscopy planned due to persistent DUB with Skyla IUD.  This is second IUD she's had.  She is a smoker and cannot be on combination OCPs.  She is not desirous of child bearing and wants definitive treatment.  PUS 03/09/17 showed a left hydrosalpinx as well.  She is aware I am planning on remove both fallopian tubes as well.  Pt has hx of LEEP 2011 due to dysplasia.  Follow up pap smears have been normal.     Procedure discussed with patient.  Hospital stay, recovery and pain management all discussed.  Risks discussed including but not limited to bleeding, 1% risk of receiving a  transfusion, infection, 3-4% risk of bowel/bladder/ureteral/vascular injury discussed as well as possible need for additional surgery if injury does occur discussed.  DVT/PE and rare risk of death discussed.  My actual complications with prior surgeries discussed.  Vaginal cuff dehiscence discussed.  Hernia formation discussed.  Positioning and incision locations discussed.  Patient aware if pathology abnormal she may need additional treatment.  All questions answered.    Ob Hx:   Patient's last menstrual period was 03/03/2017 (approximate).          Sexually active: Yes.   Birth control: [name] IUD Last pap: 1/18 Last MMG: 1/17 Tobacco: daily vaping with 3% nicotine  Past Surgical History:  Procedure Laterality Date  . CERVICAL BIOPSY  W/ LOOP ELECTRODE EXCISION  10/11   CIN I & II- Margins Negative  . HEEL SPUR SURGERY Left    4/09    Past Medical History:  Diagnosis Date  . 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 Elevated LFTs   . Migraine   . Sciatic nerve injury    strain  . Vertigo 4/12    Allergies: Doxycycline  Current Outpatient Prescriptions  Medication Sig Dispense Refill  .  cetirizine (ZYRTEC) 10 MG tablet Take 10 mg by mouth daily as needed for allergies.    Marland Kitchen FLUoxetine (PROZAC) 20 MG capsule Take 1 capsule (20 mg total) by mouth 2 (two) times daily. 60 capsule 13  . ibuprofen (ADVIL,MOTRIN) 200 MG tablet Take 400 mg by mouth every 6 (six) hours as needed for mild pain.    . Levonorgestrel (SKYLA) 13.5 MG IUD by Intrauterine route.    . Melatonin 3 MG TABS Take 3 mg by mouth at bedtime as needed (sleep).    . Multiple Vitamins-Minerals (MULTIVITAMIN PO) Take by mouth. Ulta Mega MVI    . naproxen (NAPROSYN) 500 MG tablet Take 1 tablet (500 mg total) by mouth 2 (two) times daily with a meal. 30 tablet 0  . OVER THE COUNTER MEDICATION Take 2 tablets by mouth daily. [employer] Brand Multi Vitamin and Fish Oil supplement    . SUMAtriptan (IMITREX) 100 MG tablet Take 1 tablet (100 mg total) by mouth every 2 (two) hours as needed for migraine. 9 tablet 13  . Vitamin D, Ergocalciferol, (DRISDOL) 50000 units CAPS capsule Take 1 capsule (50,000 Units total) by mouth every 7 (seven) days. 12 capsule 0   No current facility-administered medications for this visit.     ROS: Pertinent items noted in HPI and remainder of comprehensive ROS otherwise negative.  Exam:    BP 108/70 (BP Location: Right Arm, Patient Position: Sitting,  Cuff Size: Normal)   Pulse 84   Resp 16   Wt 160 lb (72.6 kg)   LMP 03/03/2017 (Approximate)   BMI 25.82 kg/m   General appearance: alert and cooperative Head: Normocephalic, without obvious abnormality, atraumatic Neck: no adenopathy, supple, symmetrical, trachea midline and thyroid not enlarged, symmetric, no tenderness/mass/nodules Lungs: clear to auscultation bilaterally Heart: regular rate and rhythm, S1, S2 normal, no murmur, click, rub or gallop Abdomen: soft, non-tender; bowel sounds normal; no masses,  no organomegaly Extremities: extremities normal, atraumatic, no cyanosis or edema Skin: Skin color, texture, turgor normal. No rashes or  lesions Lymph nodes: Cervical, supraclavicular, and axillary nodes normal. no inguinal nodes palpated Neurologic: Grossly normal  Pelvic: External genitalia:  no lesions              Urethra: normal appearing urethra with no masses, tenderness or lesions              Bartholins and Skenes: normal                 Vagina: normal appearing vagina with normal color and discharge, no lesions              Cervix: normal appearance and IUD string noted              Pap taken: No.        Bimanual Exam:  Uterus:  uterus is normal size, shape, consistency and nontender                                      Adnexa:    normal adnexa in size, nontender and no masses                                      Rectovaginal: Deferred                                      Anus:  No abnormal skin lesions  Due to DUB, endometrial biopsy recommended.  Discussed with patient.  Verbal and written consent obtained.   Procedure:  Speculum placed.  Cervix visualized and cleansed with betadine prep.  A single toothed tenaculum was not applied to the anterior lip of the cervix.  Endometrial pipelle was advanced through the cervix into the endometrial cavity without difficulty.  Pipelle passed to 7cm.  Suction applied and pipelle removed with good tissue sample obtained.  Tenculum removed.  No bleeding noted.  Patient tolerated procedure well.   A: Persistent DUB Skyla IUD Left hydrosalpinx Recurrent BV     P:  TLH/bilateral salpingectomy/possible BSO/cystoscopy planned Rx for Motrin and Percocet given.   Will treat preoperatively with Metrogel 5.09% one applicator nightly x 5 nights.  Diflucan 150mg  po x 1, repeat 72 hours if needed.  #2/0RF to pharmacy Hysterectomy brochure given for pre and post op instructions.  ~25 minutes spent with patient >50% of time was in face to face discussion of above.

## 2017-03-24 ENCOUNTER — Telehealth: Payer: Self-pay | Admitting: Obstetrics & Gynecology

## 2017-03-24 ENCOUNTER — Other Ambulatory Visit: Payer: Self-pay | Admitting: Obstetrics & Gynecology

## 2017-03-24 NOTE — Telephone Encounter (Signed)
Routing to provider for final review. Patient agreeable to disposition. Will close encounter.     

## 2017-03-24 NOTE — Telephone Encounter (Signed)
Received call from patient regarding forms. She states she spoke with her company and clarified when Deloris Ping should expect to receive the forms. She states her company waits until after the patient has completed her surgery prior to sending any forms. She states Deloris Ping should expect the forms on Tuesday, 03-28-17.  No further questions.  Routing to [place] for review. Cc: Lamont Snowball

## 2017-03-24 NOTE — Telephone Encounter (Signed)
Patient provided payment for FMLA forms on 03/21/17, as collected by the front desk.  I called the patient and spoke with her on 03/21/17 to advise, we have not received any FMLA forms on her behalf. Patient thought insurance company had sent forms in and states she will check with her company.  I called patient today, 03/24/17 and left a voicemail stating "the forms have not been received" and requested a return call.  Upon return call will need to see how the patient wants to proceed.  Routing to [employer]

## 2017-03-25 ENCOUNTER — Encounter (HOSPITAL_COMMUNITY): Payer: Self-pay | Admitting: Anesthesiology

## 2017-03-25 NOTE — Anesthesia Preprocedure Evaluation (Addendum)
Anesthesia Evaluation  Patient identified by MRN, date of birth, ID band Patient awake    Reviewed: Allergy & Precautions, NPO status , Patient's Chart, lab work & pertinent test results  Airway Mallampati: II  TM Distance: >3 FB Neck ROM: Full    Dental  (+) Teeth Intact, Dental Advisory Given   Pulmonary neg pulmonary ROS, Current Smoker,    breath sounds clear to auscultation       Cardiovascular negative cardio ROS   Rhythm:Regular Rate:Normal     Neuro/Psych  Headaches,  Neuromuscular disease negative psych ROS   GI/Hepatic negative GI ROS, Neg liver ROS,   Endo/Other  negative endocrine ROS  Renal/GU negative Renal ROS  negative genitourinary   Musculoskeletal negative musculoskeletal ROS (+)   Abdominal   Peds negative pediatric ROS (+)  Hematology negative hematology ROS (+)   Anesthesia Other Findings Day of surgery medications reviewed with the patient.  Reproductive/Obstetrics negative OB ROS                            Anesthesia Physical Anesthesia Plan  ASA: II  Anesthesia Plan: General   Post-op Pain Management:    Induction: Intravenous  Airway Management Planned: Oral ETT  Additional Equipment:   Intra-op Plan:   Post-operative Plan: Extubation in OR  Informed Consent: I have reviewed the patients History and Physical, chart, labs and discussed the procedure including the risks, benefits and alternatives for the proposed anesthesia with the patient or authorized representative who has indicated his/her understanding and acceptance.     Plan Discussed with: CRNA  Anesthesia Plan Comments:         Anesthesia Quick Evaluation

## 2017-03-26 MED ORDER — ENOXAPARIN SODIUM 40 MG/0.4ML ~~LOC~~ SOLN
40.0000 mg | SUBCUTANEOUS | Status: AC
  Administered 2017-03-27: 40 mg via SUBCUTANEOUS
  Filled 2017-03-26: qty 0.4

## 2017-03-26 NOTE — H&P (Signed)
Heather Acevedo is an 46 y.o. female with persistent DUB who is desirous of definitive treatment.  She's been on POP and has used a progesterone IUD with persistent and irregular bleeding over the past several years.  She is just tired of it.  Most recent ultrasound performed 03/09/17.  Uterus was normal sized, bilateral ovarian follicles were noted, left hydrosalpinx was also noted.  Endometrial biopsy was performed 03/21/17 and this was negative.  IUD has not been removed. She has been counseled about alternative treatments.  However, she has failed POPs in the past and is a smoker so cannot use OCPs.  Alternative options reviewed.  She is adamant about definitive treatment and is here to proceed with this today.  Pertinent Gynecological History: Menses: irregular bleeding Bleeding: dysfunctional uterine bleeding Contraception: IUD DES exposure: denies Blood transfusions: none Sexually transmitted diseases: none Previous GYN Procedures: LEEP  Last mammogram: 2017, negative Last pap: normal pap 2/18, neg HR HPV 2016 OB History: G2, P0, A2   Menstrual History: Patient's last menstrual period was 03/03/2017 (approximate).    Past Medical History:  Diagnosis Date  . 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 Elevated LFTs   . Migraine   . Sciatic nerve injury    strain  . Vertigo 4/12    Past Surgical History:  Procedure Laterality Date  . CERVICAL BIOPSY  W/ LOOP ELECTRODE EXCISION  10/11   CIN I & II- Margins Negative  . HEEL SPUR SURGERY Left    4/09    Family History  Problem Relation Age of Onset  . Breast cancer Paternal Grandmother     and ovarian  . Diabetes Paternal Grandmother   . Hypertension Mother     Social History:  reports that she has been smoking.  She has never used smokeless tobacco. She reports that she drinks about 0.5 - 1.0 oz of alcohol per week . She reports that she does not use drugs.  Allergies:  Allergies  Allergen Reactions   . Doxycycline Rash    No prescriptions prior to admission.    Review of Systems  All other systems reviewed and are negative.   Last menstrual period 03/03/2017. Physical Exam  Constitutional: She is oriented to person, place, and time. She appears well-developed and well-nourished.  Cardiovascular: Normal rate and regular rhythm.   Respiratory: Effort normal and breath sounds normal.  GI: Soft. Bowel sounds are normal.  Neurological: She is alert and oriented to person, place, and time.  Skin: Skin is warm and dry.    No results found for this or any previous visit (from the past 24 hour(s)).  No results found.  Assessment/Plan: 46 yo G2A2 MAAF here for definitive treatment of DUB.  Risks, benefits, procedure has been reviewed.  She is desirous to proceed with definitive management.  All questions answered.  Hale Bogus SUZANNE 03/26/2017, 7:06 PM

## 2017-03-27 ENCOUNTER — Encounter (HOSPITAL_COMMUNITY): Payer: Self-pay | Admitting: *Deleted

## 2017-03-27 ENCOUNTER — Ambulatory Visit (HOSPITAL_COMMUNITY): Payer: 59 | Admitting: Anesthesiology

## 2017-03-27 ENCOUNTER — Encounter (HOSPITAL_COMMUNITY)
Admission: RE | Disposition: A | Discharge status: Home / Self Care | Payer: Self-pay | Source: Ambulatory Visit | Attending: Obstetrics & Gynecology

## 2017-03-27 ENCOUNTER — Ambulatory Visit (HOSPITAL_COMMUNITY)
Admission: RE | Admit: 2017-03-27 | Discharge: 2017-03-27 | Disposition: A | Discharge status: Home / Self Care | Payer: 59 | Source: Ambulatory Visit | Attending: Obstetrics & Gynecology | Admitting: Obstetrics & Gynecology

## 2017-03-27 DIAGNOSIS — N938 Other specified abnormal uterine and vaginal bleeding: Secondary | ICD-10-CM | POA: Diagnosis not present

## 2017-03-27 DIAGNOSIS — Z30432 Encounter for removal of intrauterine contraceptive device: Secondary | ICD-10-CM | POA: Insufficient documentation

## 2017-03-27 DIAGNOSIS — Z79899 Other long term (current) drug therapy: Secondary | ICD-10-CM | POA: Diagnosis not present

## 2017-03-27 DIAGNOSIS — R102 Pelvic and perineal pain: Secondary | ICD-10-CM | POA: Insufficient documentation

## 2017-03-27 DIAGNOSIS — G43909 Migraine, unspecified, not intractable, without status migrainosus: Secondary | ICD-10-CM | POA: Insufficient documentation

## 2017-03-27 DIAGNOSIS — N83202 Unspecified ovarian cyst, left side: Secondary | ICD-10-CM | POA: Diagnosis not present

## 2017-03-27 DIAGNOSIS — N939 Abnormal uterine and vaginal bleeding, unspecified: Secondary | ICD-10-CM

## 2017-03-27 DIAGNOSIS — D259 Leiomyoma of uterus, unspecified: Secondary | ICD-10-CM

## 2017-03-27 DIAGNOSIS — N800 Endometriosis of uterus: Secondary | ICD-10-CM | POA: Insufficient documentation

## 2017-03-27 DIAGNOSIS — N803 Endometriosis of pelvic peritoneum: Secondary | ICD-10-CM

## 2017-03-27 DIAGNOSIS — F17200 Nicotine dependence, unspecified, uncomplicated: Secondary | ICD-10-CM | POA: Diagnosis not present

## 2017-03-27 HISTORY — PX: TOTAL LAPAROSCOPIC HYSTERECTOMY WITH SALPINGECTOMY: SHX6742

## 2017-03-27 HISTORY — PX: CYSTOSCOPY: SHX5120

## 2017-03-27 HISTORY — PX: IUD REMOVAL: SHX5392

## 2017-03-27 LAB — PREGNANCY, URINE: Preg Test, Ur: NEGATIVE

## 2017-03-27 LAB — HEMOGLOBIN: HEMOGLOBIN: 10.6 g/dL — AB (ref 12.0–15.0)

## 2017-03-27 SURGERY — HYSTERECTOMY, TOTAL, LAPAROSCOPIC, WITH SALPINGECTOMY
Anesthesia: General | Site: Vagina

## 2017-03-27 MED ORDER — LIDOCAINE HCL (CARDIAC) 20 MG/ML IV SOLN
INTRAVENOUS | Status: AC
  Filled 2017-03-27: qty 5

## 2017-03-27 MED ORDER — LACTATED RINGERS IR SOLN
Status: DC | PRN
  Administered 2017-03-27: 3000 mL

## 2017-03-27 MED ORDER — HYDROMORPHONE HCL 1 MG/ML IJ SOLN
0.2500 mg | INTRAMUSCULAR | Status: DC | PRN
  Administered 2017-03-27: 0.5 mg via INTRAVENOUS

## 2017-03-27 MED ORDER — MENTHOL 3 MG MT LOZG
1.0000 | LOZENGE | OROMUCOSAL | Status: DC | PRN
  Filled 2017-03-27: qty 9

## 2017-03-27 MED ORDER — SCOPOLAMINE 1 MG/3DAYS TD PT72
MEDICATED_PATCH | TRANSDERMAL | Status: AC
  Administered 2017-03-27: 1.5 mg via TRANSDERMAL
  Filled 2017-03-27: qty 1

## 2017-03-27 MED ORDER — OXYCODONE-ACETAMINOPHEN 5-325 MG PO TABS: 1.0000 | ORAL_TABLET | ORAL | Status: DC | PRN

## 2017-03-27 MED ORDER — ONDANSETRON HCL 4 MG/2ML IJ SOLN
INTRAMUSCULAR | Status: AC
  Filled 2017-03-27: qty 2

## 2017-03-27 MED ORDER — SUMATRIPTAN SUCCINATE 100 MG PO TABS
100.0000 mg | ORAL_TABLET | ORAL | Status: DC | PRN
  Filled 2017-03-27: qty 1

## 2017-03-27 MED ORDER — FLUOXETINE HCL 20 MG PO CAPS
20.0000 mg | ORAL_CAPSULE | Freq: Two times a day (BID) | ORAL | Status: DC
Start: 2017-03-28 — End: 2017-03-27

## 2017-03-27 MED ORDER — ROCURONIUM BROMIDE 100 MG/10ML IV SOLN
INTRAVENOUS | Status: AC
  Filled 2017-03-27: qty 1

## 2017-03-27 MED ORDER — BUPIVACAINE HCL (PF) 0.25 % IJ SOLN
INTRAMUSCULAR | Status: AC
  Filled 2017-03-27: qty 30

## 2017-03-27 MED ORDER — ALUM & MAG HYDROXIDE-SIMETH 200-200-20 MG/5ML PO SUSP: 30.0000 mL | ORAL | Status: DC | PRN

## 2017-03-27 MED ORDER — DEXAMETHASONE SODIUM PHOSPHATE 4 MG/ML IJ SOLN
INTRAMUSCULAR | Status: DC | PRN
  Administered 2017-03-27: 4 mg via INTRAVENOUS

## 2017-03-27 MED ORDER — ACETAMINOPHEN 325 MG PO TABS: 650.0000 mg | ORAL_TABLET | ORAL | Status: DC | PRN

## 2017-03-27 MED ORDER — PHENYLEPHRINE 40 MCG/ML (10ML) SYRINGE FOR IV PUSH (FOR BLOOD PRESSURE SUPPORT)
PREFILLED_SYRINGE | INTRAVENOUS | Status: AC
  Filled 2017-03-27: qty 10

## 2017-03-27 MED ORDER — OXYCODONE HCL 5 MG PO TABS: 5.0000 mg | ORAL_TABLET | Freq: Once | ORAL | Status: DC | PRN

## 2017-03-27 MED ORDER — KETOROLAC TROMETHAMINE 30 MG/ML IJ SOLN
INTRAMUSCULAR | Status: DC | PRN
  Administered 2017-03-27: 30 mg via INTRAVENOUS

## 2017-03-27 MED ORDER — DEXTROSE-NACL 5-0.45 % IV SOLN: INTRAVENOUS | Status: DC

## 2017-03-27 MED ORDER — DEXAMETHASONE SODIUM PHOSPHATE 4 MG/ML IJ SOLN
INTRAMUSCULAR | Status: AC
  Filled 2017-03-27: qty 1

## 2017-03-27 MED ORDER — CEFOTETAN DISODIUM-DEXTROSE 2-2.08 GM-% IV SOLR
INTRAVENOUS | Status: AC
  Filled 2017-03-27: qty 50

## 2017-03-27 MED ORDER — SIMETHICONE 80 MG PO CHEW: 80.0000 mg | CHEWABLE_TABLET | Freq: Four times a day (QID) | ORAL | Status: DC | PRN

## 2017-03-27 MED ORDER — PHENYLEPHRINE HCL 10 MG/ML IJ SOLN
INTRAMUSCULAR | Status: DC | PRN
  Administered 2017-03-27: 40 ug via INTRAVENOUS
  Administered 2017-03-27: 100 ug via INTRAVENOUS
  Administered 2017-03-27: 60 ug via INTRAVENOUS
  Administered 2017-03-27: 40 ug via INTRAVENOUS

## 2017-03-27 MED ORDER — BUPIVACAINE HCL (PF) 0.25 % IJ SOLN
INTRAMUSCULAR | Status: DC | PRN
  Administered 2017-03-27: 10 mL

## 2017-03-27 MED ORDER — LIDOCAINE HCL (CARDIAC) 20 MG/ML IV SOLN
INTRAVENOUS | Status: DC | PRN
  Administered 2017-03-27: 80 mg via INTRAVENOUS

## 2017-03-27 MED ORDER — FENTANYL CITRATE (PF) 100 MCG/2ML IJ SOLN
INTRAMUSCULAR | Status: DC | PRN
  Administered 2017-03-27 (×2): 25 ug via INTRAVENOUS
  Administered 2017-03-27 (×2): 100 ug via INTRAVENOUS

## 2017-03-27 MED ORDER — PANTOPRAZOLE SODIUM 40 MG IV SOLR
40.0000 mg | Freq: Every day | INTRAVENOUS | Status: DC
  Filled 2017-03-27 (×2): qty 40

## 2017-03-27 MED ORDER — PROPOFOL 10 MG/ML IV BOLUS
INTRAVENOUS | Status: AC
  Filled 2017-03-27: qty 20

## 2017-03-27 MED ORDER — MIDAZOLAM HCL 2 MG/2ML IJ SOLN
INTRAMUSCULAR | Status: AC
  Filled 2017-03-27: qty 2

## 2017-03-27 MED ORDER — LACTATED RINGERS IV SOLN
INTRAVENOUS | Status: DC
  Administered 2017-03-27: 1000 mL via INTRAVENOUS

## 2017-03-27 MED ORDER — MIDAZOLAM HCL 2 MG/2ML IJ SOLN
INTRAMUSCULAR | Status: DC | PRN
  Administered 2017-03-27: 2 mg via INTRAVENOUS

## 2017-03-27 MED ORDER — HYDROMORPHONE HCL 1 MG/ML IJ SOLN
INTRAMUSCULAR | Status: DC
Start: 2017-03-27 — End: 2017-03-27
  Filled 2017-03-27: qty 1

## 2017-03-27 MED ORDER — CEFOTETAN DISODIUM-DEXTROSE 2-2.08 GM-% IV SOLR
2.0000 g | INTRAVENOUS | Status: AC
  Administered 2017-03-27: 2 g via INTRAVENOUS

## 2017-03-27 MED ORDER — PROPOFOL 10 MG/ML IV BOLUS
INTRAVENOUS | Status: DC | PRN
  Administered 2017-03-27: 160 mg via INTRAVENOUS
  Administered 2017-03-27: 40 mg via INTRAVENOUS

## 2017-03-27 MED ORDER — PROMETHAZINE HCL 25 MG/ML IJ SOLN: 6.2500 mg | INTRAMUSCULAR | Status: DC | PRN

## 2017-03-27 MED ORDER — KETOROLAC TROMETHAMINE 30 MG/ML IJ SOLN
30.0000 mg | Freq: Four times a day (QID) | INTRAMUSCULAR | Status: DC
  Administered 2017-03-27 (×2): 30 mg via INTRAVENOUS
  Filled 2017-03-27 (×2): qty 1

## 2017-03-27 MED ORDER — KETOROLAC TROMETHAMINE 30 MG/ML IJ SOLN
INTRAMUSCULAR | Status: AC
  Filled 2017-03-27: qty 1

## 2017-03-27 MED ORDER — SODIUM CHLORIDE 0.9 % IV SOLN
INTRAVENOUS | Status: DC | PRN
  Administered 2017-03-27: 08:00:00

## 2017-03-27 MED ORDER — ONDANSETRON HCL 4 MG/2ML IJ SOLN
INTRAMUSCULAR | Status: DC | PRN
  Administered 2017-03-27: 4 mg via INTRAVENOUS

## 2017-03-27 MED ORDER — LACTATED RINGERS IV SOLN
INTRAVENOUS | Status: DC
  Administered 2017-03-27 (×2): via INTRAVENOUS

## 2017-03-27 MED ORDER — OXYCODONE HCL 5 MG/5ML PO SOLN: 5.0000 mg | Freq: Once | ORAL | Status: DC | PRN

## 2017-03-27 MED ORDER — ROPIVACAINE HCL 5 MG/ML IJ SOLN
INTRAMUSCULAR | Status: AC
  Filled 2017-03-27: qty 30

## 2017-03-27 MED ORDER — KETOROLAC TROMETHAMINE 30 MG/ML IJ SOLN: 30.0000 mg | Freq: Four times a day (QID) | INTRAMUSCULAR | Status: DC

## 2017-03-27 MED ORDER — ROCURONIUM BROMIDE 100 MG/10ML IV SOLN
INTRAVENOUS | Status: DC | PRN
  Administered 2017-03-27: 40 mg via INTRAVENOUS
  Administered 2017-03-27 (×3): 10 mg via INTRAVENOUS

## 2017-03-27 MED ORDER — MEPERIDINE HCL 25 MG/ML IJ SOLN: 6.2500 mg | INTRAMUSCULAR | Status: DC | PRN

## 2017-03-27 MED ORDER — MORPHINE SULFATE (PF) 4 MG/ML IV SOLN: 1.0000 mg | INTRAVENOUS | Status: DC | PRN

## 2017-03-27 MED ORDER — SCOPOLAMINE 1 MG/3DAYS TD PT72
1.0000 | MEDICATED_PATCH | Freq: Once | TRANSDERMAL | Status: DC
  Administered 2017-03-27: 1.5 mg via TRANSDERMAL

## 2017-03-27 MED ORDER — SUGAMMADEX SODIUM 200 MG/2ML IV SOLN
INTRAVENOUS | Status: DC | PRN
  Administered 2017-03-27: 150 mg via INTRAVENOUS

## 2017-03-27 MED ORDER — FENTANYL CITRATE (PF) 250 MCG/5ML IJ SOLN
INTRAMUSCULAR | Status: AC
  Filled 2017-03-27: qty 5

## 2017-03-27 MED ORDER — SODIUM CHLORIDE 0.9 % IJ SOLN
INTRAMUSCULAR | Status: AC
  Filled 2017-03-27: qty 50

## 2017-03-27 SURGICAL SUPPLY — 51 items
APL SRG 38 LTWT LNG FL B (MISCELLANEOUS)
APPLICATOR ARISTA FLEXITIP XL (MISCELLANEOUS) IMPLANT
BARRIER ADHS 3X4 INTERCEED (GAUZE/BANDAGES/DRESSINGS) ×2 IMPLANT
BRR ADH 4X3 ABS CNTRL BYND (GAUZE/BANDAGES/DRESSINGS) ×3
CABLE HIGH FREQUENCY MONO STRZ (ELECTRODE) ×2 IMPLANT
CLOTH BEACON ORANGE TIMEOUT ST (SAFETY) ×5 IMPLANT
COVER LIGHT HANDLE  1/PK (MISCELLANEOUS) ×2
COVER LIGHT HANDLE 1/PK (MISCELLANEOUS) ×3 IMPLANT
COVER MAYO STAND STRL (DRAPES) ×5 IMPLANT
DURAPREP 26ML APPLICATOR (WOUND CARE) ×5 IMPLANT
GLOVE BIOGEL PI IND STRL 7.0 (GLOVE) ×15 IMPLANT
GLOVE BIOGEL PI INDICATOR 7.0 (GLOVE) ×10
GLOVE ECLIPSE 6.5 STRL STRAW (GLOVE) ×15 IMPLANT
GOWN STRL REUS W/TWL LRG LVL3 (GOWN DISPOSABLE) ×20 IMPLANT
HEMOSTAT ARISTA ABSORB 3G PWDR (MISCELLANEOUS) IMPLANT
LIGASURE VESSEL 5MM BLUNT TIP (ELECTROSURGICAL) ×5 IMPLANT
NEEDLE INSUFFLATION 120MM (ENDOMECHANICALS) ×5 IMPLANT
NS IRRIG 1000ML POUR BTL (IV SOLUTION) ×5 IMPLANT
OCCLUDER COLPOPNEUMO (BALLOONS) ×5 IMPLANT
PACK LAPAROSCOPY BASIN (CUSTOM PROCEDURE TRAY) ×5 IMPLANT
PACK TRENDGUARD 450 HYBRID PRO (MISCELLANEOUS) IMPLANT
PACK TRENDGUARD 600 HYBRD PROC (MISCELLANEOUS) IMPLANT
POUCH LAPAROSCOPIC INSTRUMENT (MISCELLANEOUS) ×5 IMPLANT
PROTECTOR NERVE ULNAR (MISCELLANEOUS) ×10 IMPLANT
SCISSORS LAP 5X35 DISP (ENDOMECHANICALS) ×5 IMPLANT
SET CYSTO W/LG BORE CLAMP LF (SET/KITS/TRAYS/PACK) ×2 IMPLANT
SET IRRIG TUBING LAPAROSCOPIC (IRRIGATION / IRRIGATOR) ×5 IMPLANT
SET TRI-LUMEN FLTR TB AIRSEAL (TUBING) ×5 IMPLANT
SHEARS HARMONIC ACE PLUS 36CM (ENDOMECHANICALS) ×2 IMPLANT
SOLUTION ELECTROLUBE (MISCELLANEOUS) ×5 IMPLANT
SUT VIC AB 0 CT1 27 (SUTURE) ×10
SUT VIC AB 0 CT1 27XBRD ANBCTR (SUTURE) ×6 IMPLANT
SUT VICRYL 0 UR6 27IN ABS (SUTURE) ×5 IMPLANT
SUT VICRYL 4-0 PS2 18IN ABS (SUTURE) ×5 IMPLANT
SUT VLOC 180 0 9IN  GS21 (SUTURE) ×2
SUT VLOC 180 0 9IN GS21 (SUTURE) ×3 IMPLANT
SYR 50ML LL SCALE MARK (SYRINGE) ×10 IMPLANT
SYRINGE 10CC LL (SYRINGE) ×5 IMPLANT
SYSTEM CARTER THOMASON II (TROCAR) IMPLANT
TIP UTERINE 5.1X6CM LAV DISP (MISCELLANEOUS) IMPLANT
TIP UTERINE 6.7X10CM GRN DISP (MISCELLANEOUS) ×2 IMPLANT
TIP UTERINE 6.7X6CM WHT DISP (MISCELLANEOUS) IMPLANT
TIP UTERINE 6.7X8CM BLUE DISP (MISCELLANEOUS) IMPLANT
TOWEL OR 17X24 6PK STRL BLUE (TOWEL DISPOSABLE) ×10 IMPLANT
TRENDGUARD 450 HYBRID PRO PACK (MISCELLANEOUS) ×4
TRENDGUARD 600 HYBRID PROC PK (MISCELLANEOUS)
TROCAR ADV FIXATION 5X100MM (TROCAR) ×5 IMPLANT
TROCAR PORT AIRSEAL 5X120 (TROCAR) ×5 IMPLANT
TROCAR XCEL NON BLADE 8MM B8LT (ENDOMECHANICALS) ×5 IMPLANT
TROCAR XCEL NON-BLD 5MMX100MML (ENDOMECHANICALS) ×5 IMPLANT
WARMER LAPAROSCOPE (MISCELLANEOUS) ×5 IMPLANT

## 2017-03-27 NOTE — Anesthesia Postprocedure Evaluation (Addendum)
Anesthesia Post Note  Patient: Heather Acevedo  Procedure(s) Performed: Procedure(s) (LRB): HYSTERECTOMY TOTAL LAPAROSCOPIC WITH BILATERAL  SALPINGECTOMY, LEFT OOPHERECTOMY (N/A) CYSTOSCOPY (N/A) INTRAUTERINE DEVICE (IUD) REMOVAL  Patient location during evaluation: [facility] Anesthesia Type: General Level of consciousness: awake and alert and oriented Pain management: pain level controlled Vital Signs Assessment: post-procedure vital signs reviewed and stable Respiratory status: nonlabored ventilation, spontaneous breathing and respiratory function stable Cardiovascular status: stable Postop Assessment: adequate PO intake and no signs of nausea or vomiting Anesthetic complications: no        Last Vitals:  Vitals:   03/27/17 1245 03/27/17 1310  BP: 117/72 112/75  Pulse: 74 78  Resp: 14 16  Temp: 37.4 C 37 C    Last Pain:  Vitals:   03/27/17 1331  TempSrc:   PainSc: 5    Pain Goal: Patients Stated Pain Goal: 0 (03/27/17 1145)               GREGORY,SUZANNE

## 2017-03-27 NOTE — OR Nursing (Signed)
Attempted to update family. They were not in the waiting room.

## 2017-03-27 NOTE — Progress Notes (Signed)
Day of Surgery Procedure(s) (LRB): HYSTERECTOMY TOTAL LAPAROSCOPIC WITH BILATERAL  SALPINGECTOMY, LEFT OOPHERECTOMY (N/A) CYSTOSCOPY (N/A) INTRAUTERINE DEVICE (IUD) REMOVAL  Subjective: Patient reports she has excellent pain control.  She denies nausea.  She's eaten a grilled cheese.  She's passing gas.  She's voided and walked the halls.  She would like to go home.  Objective: I have reviewed patient's vital signs, intake and output, medications and labs.  General: alert and cooperative Resp: clear to auscultation bilaterally Cardio: regular rate and rhythm, S1, S2 normal, no murmur, click, rub or gallop GI: soft, non-tender; bowel sounds normal; no masses,  no organomegaly and incision: clean, dry and intact Extremities: extremities normal, atraumatic, no cyanosis or edema Vaginal Bleeding: none  Assessment: s/p Procedure(s): HYSTERECTOMY TOTAL LAPAROSCOPIC WITH BILATERAL  SALPINGECTOMY, LEFT OOPHERECTOMY (N/A) CYSTOSCOPY (N/A) INTRAUTERINE DEVICE (IUD) REMOVAL: progressing well  Plan: Discharge home  LOS: 0 days    Lyman Speller 03/27/2017, 6:34 PM

## 2017-03-27 NOTE — Anesthesia Procedure Notes (Addendum)
Procedure Name: Intubation Date/Time: 03/27/2017 7:36 AM Performed by: Raenette Rover Pre-anesthesia Checklist: Patient identified, Emergency Drugs available, Suction available and Patient being monitored Patient Re-evaluated:Patient Re-evaluated prior to inductionOxygen Delivery Method: Circle system utilized Preoxygenation: Pre-oxygenation with 100% oxygen Intubation Type: IV induction Ventilation: Mask ventilation without difficulty Laryngoscope Size: Mac and 3 Grade View: Grade I Tube type: Oral Tube size: 7.0 mm Number of attempts: 1 Airway Equipment and Method: Stylet Placement Confirmation: ETT inserted through vocal cords under direct vision,  positive ETCO2,  CO2 detector and breath sounds checked- equal and bilateral Secured at: 22 cm Tube secured with: Tape

## 2017-03-27 NOTE — Op Note (Signed)
03/27/2017  10:14 AM  PATIENT:  Heather Acevedo  46 y.o. female  PRE-OPERATIVE DIAGNOSIS:  DUB, fibroids, pelvic pain  POST-OPERATIVE DIAGNOSIS:  DUB, fibroids, pelvic pain, Stage 2 endometriosis, left hemorrhagic ovarian cyst  PROCEDURE:  Procedure(s): HYSTERECTOMY TOTAL LAPAROSCOPIC WITH BILATERAL  SALPINGECTOMY, LEFT OOPHERECTOMY CYSTOSCOPY INTRAUTERINE DEVICE (IUD) REMOVAL  SURGEON:  Kaled Allende SUZANNE  ASSISTANTS: Brook Silva   ANESTHESIA:   general  ESTIMATED BLOOD LOSS: 100cc   BLOOD ADMINISTERED:none   FLUIDS: 1500cc LR  UOP: 200cc clear UOP  SPECIMEN:  Uterus and cervix, left fallopian tube and ovary, right fallopian tube  DISPOSITION OF SPECIMEN:  PATHOLOGY  FINDINGS: endometriosis in LLQ with adhesion of the ovary to the side wall.  Endometriosis implants appear to be on pelvic side wall and left ovary  DESCRIPTION OF OPERATION: Patient is taken to the operating room. She is placed in the supine position. She is a running IV in place. Informed consent was present on the chart. SCDs on her lower extremities and functioning properly. General endotracheal anesthesia was administered by the anesthesia staff without difficulty. Once adequate anesthesia was confirmed the legs are placed in the low lithotomy position in [place]. Her arms were tucked by the side.   Dura prep was then used to prep the abdomen and Betadine was used to prep the inner thighs, perineum and vagina. Once 3 minutes had past the patient was draped in a normal standard fashion. The legs were lifted to the high lithotomy position. The cervix was visualized by placing a heavy weighted speculum in the posterior aspect of the vagina and using a curved Deaver retractor to the retract anteriorly. The anterior lip of the cervix was grasped with single-tooth tenaculum.  The cervix sounded to 7 cm. Pratt dilators were used to dilate the cervix up to a #21. A RUMI uterine manipulator was obtained. A #6   Purple disposable tip was placed on the RUMI manipulator as well as a medium, silver KOH ring. This was passed through the cervix and the bulb of the disposable tip was inflated with 8 cc of normal saline. There was a good fit of the KOH ring around the cervix. The tenaculum was removed. There is also good manipulation of the uterus. The speculum and retractor were removed as well. A Foley catheter was placed to straight drain. The concentrated urine was noted. Legs were lowered to the low lithotomy position and attention was turned the abdomen.  The umbilicus was everted.  A Veress needle was obtained. Syringe of sterile saline was placed on a open Veress needle.  This was passed into the umbilicus until just when the fluid started to drip.  Then low flow CO2 gas was attached the needle and the pneumoperitoneum was achieved without difficulty. Once 2.4 liters of gas was in the abdomen the Veress needle was removed and a 5 millimeter non-bladed Optiview trocar and port were passed directly to the abdomen. The laparoscope was then used to confirm intraperitoneal placement.  The left ovary was adhered to the left side wall. Filmy and dense adhesions were noted.  Ovaries were appeared normal except for a hemorrhagic cyst on the left ovary.  Locations for RLQ and LLQ ports were noted by transillumination of the abdominal wall.  Ropivicaine 0.5% mixure (mixed 1:1 with NS) used to anesthetize the skin.  17mm skin incisions were made and then a 48mm non-bladed port was placed in the LLQ.  A #5 airseal port was placed in the RLQ.  Finally a midline incision was made about 4 cm above the pubic symphysis.  The skin was incised about 1cm and a non-bladed 8 port was placed with direct visualization of the laparoscope.    Ureters were identified.  Attention was turned to the left side. Using the harmonic scalpel, the left ovarian adhesions were freed to placed the ovary back in normal anatomic position.  Because of the amount  of adhesions on the side wall and ovary, decision was made to remove the left ovary as well.  With uterus on stretch the left IP ligament was serially clamped, cauterized and incised.  Left round ligament was serially clamped cauterized and incised. The anterior and posterior peritoneum of the inferior leaf of the broad ligament were opened. The beginning of the baldder flap was created.  The bladder was taken down below the level of the KOH ring. The left uterine artery was skeletonized and then just superior to the KOH ring this vessel was serially clamped, cauterized, and incised.  Attention was turned the right side.  The uterus was placed on stretch to the opposite side.  The tube was excised off the ovary using bipolar cautery.  The mesosalpinx was incised freeing the tube. Then the right uterine ovarian pedicle was serially clamped cauterized and incised. Next the right round ligament was serially clamped cauterized and incised. The anterior posterior peritoneum of the inferiorly for the broad ligament were opened. The anterior peritoneum was carried across to the dissection on the left side. The remainder of the bladder flap was created using sharp dissection. The bladder was well below the level of the KOH ring. The left uterine artery skeletonized. Then the left uterine artery, above the level of the KOH ring, was serially clamped cauterized and incised. The uterus was not fully devascularized at this point so attention was turned back to the left side.  There was another area of vasculature just underneath the previously cauterized and incised left uterine artery.  Just above the KOH ring, this was serially clamped, cauterized and incised.  The uterus was devascularized at this point.  The colpotomy was performed a starting in the midline and using a harmonic scalpel with the inferior edge of the open blade  This was carried around a circumferential fashion until the vaginal mucosa was completely  incised in the specimen was freed.  The specimen was then delivered to the vagina.  A vaginal occlusive device was used to maintain the pneumoperitoneum  Instruments were changed with a needle driver and Kobra graspers.  Using a 9 inch V. lock suture, the cuff was closed by incorporating the anterior and posterior vaginal mucosa in each stitch. This was carried across all the way to the left corner and a running fashion. Two stitches were brought back towards the midline and the suture was cut flush with the vagina. The needle was brought out the pelvis. The pelvis was irrigated. All pedicles were inspected. No bleeding was noted. In Interceed was placed across vaginal cuff and the left side wall to cover the area where the ovary was adhered. Ureters were noted deep in the pelvis to be peristalsing.  At this point the procedure was completed.  The suprapubic port and LLQ port were removed under direct visualization.  Then the airseal device was stopped, removing the pneumoperitoneum.  The RLQ port and midline port were removed.  As all of these were 28mm and less, no fascial closures were done.  The patient was taken out of  Trendelenburg positioning.   The skin was then closed with subcuticular stitches of 3-0 Vicryl. The skin was cleansed Dermabond was applied. Attention was then turned the vagina and the cuff was inspected. No bleeding was noted. The anterior posterior vaginal mucosa was incorporated in each stitch. The Foley catheter was removed.  Cystoscopy was performed.  No sutures or bladder injuries were noted.  Ureters were noted with normal urine jets from each one was seen.  Foley was left out.  Sponge, lap, needle, initially counts were correct x2. Patient tolerated the procedure very well. She was awakened from anesthesia, extubated and taken to recovery in stable condition.   COUNTS:  YES  PLAN OF CARE: Transfer to PACU

## 2017-03-27 NOTE — Discharge Instructions (Signed)
Post-surgical Instructions, Outpatient Surgery  You may expect to feel dizzy, weak, and drowsy for as long as 24 hours after receiving the medicine that made you sleep (anesthetic). For the first 24 hours after your surgery:    Do not drive a car, ride a bicycle, participate in physical activities, or take public transportation until you are done taking narcotic pain medicines or as directed by Dr. Sabra Heck.   Do not drink alcohol or take tranquilizers.   Do not take medicine that has not been prescribed by your physicians.   Do not sign important papers or make important decisions while on narcotic pain medicines.   Have a responsible person with you.   CARE OF INCISION  If you have a bandage, you may remove it in one day.  If there are steri-strips or dermabond, just let this loosen on its own.   You may shower on the first day after your surgery.  Do not sit in a tub bath for one week.  Avoid heavy lifting (more than 10 pounds/4.5 kilograms), pushing, or pulling.   Avoid activities that may risk injury to your incisions.   PAIN MANAGEMENT  Motrin 800mg .  (This is the same as 4-200mg  over the counter tablets of Motrin or ibuprofen.)  You may take this every eight hours or as needed for cramping.    Percocet 5/325mg .  For more severe pain, take one or two tablets every four to six hours as needed for pain control.  (Remember that narcotic pain medications increase your risk of constipation.  If this becomes a problem, you may take an over the counter stool softener like Colace 100mg  up to four times a day.)  DO'S AND DON'T'S  Do not take a tub bath for one week.  You may shower on the first day after your surgery  Do not do any heavy lifting for one to two weeks.  This increases the chance of bleeding.  Do move around as you feel able.  Stairs are fine.  You may begin to exercise again as you feel able.  Do not lift any weights for two weeks.  Do not put anything in the vagina  for two weeks--no tampons, intercourse, or douching.    REGULAR MEDIATIONS/VITAMINS:  You may restart all of your regular medications as prescribed.  You may restart all of your vitamins as you normally take them.    PLEASE CALL OR SEEK MEDICAL CARE IF:  You have persistent nausea and vomiting.   You have trouble eating or drinking.   You have an oral temperature above 100.5.   You have constipation that is not helped by adjusting diet or increasing fluid intake. Pain medicines are a common cause of constipation.   You have heavy vaginal bleeding  You have redness or drainage from your incision(s) or there is increasing pain or tenderness near or in the surgical site.

## 2017-03-27 NOTE — Transfer of Care (Signed)
Immediate Anesthesia Transfer of Care Note  Patient: Heather Acevedo  Procedure(s) Performed: Procedure(s): HYSTERECTOMY TOTAL LAPAROSCOPIC WITH BILATERAL  SALPINGECTOMY, LEFT OOPHERECTOMY (N/A) CYSTOSCOPY (N/A) INTRAUTERINE DEVICE (IUD) REMOVAL  Patient Location: PACU  Anesthesia Type:General  Level of Consciousness: awake, alert , oriented and patient cooperative  Airway & Oxygen Therapy: Patient Spontanous Breathing and Patient connected to nasal cannula oxygen  Post-op Assessment: Report given to RN and Post -op Vital signs reviewed and stable  Post vital signs: Reviewed and stable  Last Vitals:  Vitals:   03/27/17 0614  BP: 118/75  Pulse: 73  Resp: 16  Temp: (!) 36 C    Last Pain:  Vitals:   03/27/17 0614  TempSrc: Oral  PainSc: 0-No pain      Patients Stated Pain Goal: 0 (40/10/27 2536)  Complications: No apparent anesthesia complications

## 2017-03-27 NOTE — Progress Notes (Signed)
Pt given discharge instructions, questions answered, states understanding. Signed and given copy

## 2017-03-28 ENCOUNTER — Encounter (HOSPITAL_COMMUNITY): Payer: Self-pay | Admitting: Obstetrics & Gynecology

## 2017-03-28 ENCOUNTER — Telehealth: Payer: Self-pay | Admitting: Obstetrics & Gynecology

## 2017-03-28 NOTE — Telephone Encounter (Signed)
Spoke with Lattie Haw from [employer] department regarding leave from surgery. Verified patient had surgery on 03/27/2017. Estimated return to work is 05/08/2017. Patient has first follow up on 04/03/2017. Lattie Haw is requesting this documentation of patient's surgery and diagnosis be faxed to [phone]. States case manager will be calling to review more information tomorrow. Patient's case number is [id number]. OP and progress note faxed to number above with confirmation.  Routing to provider for final review. Patient agreeable to disposition. Will close encounter.

## 2017-03-28 NOTE — Telephone Encounter (Signed)
Louis from [employer] calling to find out patient's surgery date, and return to work date. Phone [phone] and fax number [phone].

## 2017-04-03 ENCOUNTER — Encounter: Payer: Self-pay | Admitting: Obstetrics & Gynecology

## 2017-04-03 ENCOUNTER — Ambulatory Visit (INDEPENDENT_AMBULATORY_CARE_PROVIDER_SITE_OTHER): Payer: 59 | Admitting: Obstetrics & Gynecology

## 2017-04-03 VITALS — BP 118/72 | HR 76 | Resp 16 | Wt 158.0 lb

## 2017-04-03 DIAGNOSIS — Z9889 Other specified postprocedural states: Secondary | ICD-10-CM

## 2017-04-03 NOTE — Progress Notes (Signed)
Post Operative Visit  Procedure: HYSTERECTOMY TOTAL LAPAROSCOPIC WITH BILATERAL SALPINGECTOMY, LEFT OOPHERECTOMY (N/A Abdomen)  CYSTOSCOPY (N/A )  INTRAUTERINE DEVICE (IUD) REMOVAL (Vagina ) Days Post-op: 7 days  Subjective: Doing well.  Off narcotics.  Using some antiinflammatories.  Belly button is bothering her the most.  Having normal bowel movements.  Emptying bladder without issues.  No vaginal bleeding.  Off stool softeners.    Objective: BP 118/72 (BP Location: Right Arm, Patient Position: Sitting, Cuff Size: Normal)   Pulse 76   Resp 16   Wt 158 lb (71.7 kg)   LMP 03/03/2017 (Approximate)   BMI 25.50 kg/m   EXAM General: alert and cooperative Resp: clear to auscultation bilaterally Cardio: regular rate and rhythm, S1, S2 normal, no murmur, click, rub or gallop GI: soft, non-tender; bowel sounds normal; no masses,  no organomegaly and incision: clean, dry and intact Extremities: extremities normal, atraumatic, no cyanosis or edema Vaginal Bleeding: none  Gyn:  NAEFG, cuff without erythema, no discharge, no bleeding  Assessment: s/p TLH/LSO/right salpingectomy/cystoscopy  Plan: Recheck 5 weeks

## 2017-04-11 ENCOUNTER — Encounter: Payer: Self-pay | Admitting: Obstetrics & Gynecology

## 2017-04-12 ENCOUNTER — Telehealth: Payer: Self-pay | Admitting: *Deleted

## 2017-04-12 NOTE — Telephone Encounter (Signed)
See telephone encounter dated 04/12/17 to review with provider.

## 2017-04-12 NOTE — Telephone Encounter (Signed)
Dr. Sabra Heck -see patient message below. Patient scheduled for 6 week post-op on 05/05/17 at 2:45pm, ok to move appointment up?    Non-Urgent Medical Question  Message [id number]  From Heather Acevedo To [name], MD Sent 04/11/2017 11:17 AM  I would like to set up my return to work date along with any limitations. I gave Dr. Sabra Heck a date of May 29,2018. I'd like to go with that date and have my fmla/std informed.  Thank you    Cc: Lerry Liner

## 2017-04-13 ENCOUNTER — Encounter: Payer: Self-pay | Admitting: Obstetrics & Gynecology

## 2017-04-13 NOTE — Telephone Encounter (Signed)
Spoke with Mr.s Mccosh this morning.  Letter written.  Will take to [place] to fax.  Thanks.  Routing this to [place] as well.

## 2017-04-28 NOTE — Addendum Note (Signed)
Addendum  created 04/28/17 1231 by Effie Berkshire, MD   Sign clinical note

## 2017-05-05 ENCOUNTER — Ambulatory Visit (INDEPENDENT_AMBULATORY_CARE_PROVIDER_SITE_OTHER): Payer: 59 | Admitting: Obstetrics & Gynecology

## 2017-05-05 ENCOUNTER — Encounter: Payer: Self-pay | Admitting: Obstetrics & Gynecology

## 2017-05-05 VITALS — BP 118/80 | HR 110 | Resp 16 | Ht 66.0 in | Wt 160.0 lb

## 2017-05-05 DIAGNOSIS — Z9889 Other specified postprocedural states: Secondary | ICD-10-CM

## 2017-05-05 NOTE — Progress Notes (Signed)
Post Operative Visit  Procedure: HYSTERECTOMY TOTAL LAPAROSCOPIC WITH SALPINGECTOMY CYSTOSCOPY INTRAUTERINE DEVICE (IUD) REMOVAL   Days Post-op: 40  Subjective: Doing well.  Doing light duty.  Went back full time almost two weeks ago.  Denies vaginal bleeding.  Bowel and bladder function is normal.    Objective: BP 118/80 (BP Location: Right Arm, Patient Position: Sitting, Cuff Size: Normal)   Pulse (!) 110   Resp 16   Ht 5\' 6"  (1.676 m)   Wt 160 lb (72.6 kg)   LMP 03/03/2017 (Approximate)   BMI 25.82 kg/m   EXAM General: alert and cooperative Resp: clear to auscultation bilaterally Cardio: regular rate and rhythm, S1, S2 normal, no murmur, click, rub or gallop GI: soft, non-tender; bowel sounds normal; no masses,  no organomegaly and incision: clean, dry and intact Extremities: extremities normal, atraumatic, no cyanosis or edema Vaginal Bleeding: none  GYN:  NAEFG, vaginal pink and moist, cuff has looser stitch on left side with vaginal mucosa that has healed but possibly by secondary intension, no defect present  Assessment: s/p TLH/LSO/bilateral salpingectomy, cystoscopy Hot flashes Loose stitch with evidence of cuff healing possibly by secondary intension  Plan: Recheck 4 weeks.  Continue to recommend pelvic rest to ensure cuff fully heals.  Will not recommend intercourse until suture is gone as well. D/W pt OTC products.  Do not feel she needs HRT at this point.

## 2017-05-05 NOTE — Patient Instructions (Signed)
Black cohosh or [place] pm

## 2017-05-08 ENCOUNTER — Encounter: Payer: Self-pay | Admitting: Obstetrics & Gynecology

## 2017-06-02 ENCOUNTER — Encounter: Payer: Self-pay | Admitting: Obstetrics & Gynecology

## 2017-06-02 ENCOUNTER — Ambulatory Visit (INDEPENDENT_AMBULATORY_CARE_PROVIDER_SITE_OTHER): Payer: 59 | Admitting: Obstetrics & Gynecology

## 2017-06-02 VITALS — BP 132/80 | HR 96 | Resp 16 | Ht 66.0 in | Wt 164.0 lb

## 2017-06-02 DIAGNOSIS — Z9889 Other specified postprocedural states: Secondary | ICD-10-CM

## 2017-06-02 NOTE — Progress Notes (Signed)
Post Operative Visit  Procedure: HYSTERECTOMY TOTAL LAPAROSCOPIC WITH SALPINGECTOMY CYSTOSCOPY INTRAUTERINE DEVICE (IUD) REMOVAL   Days Post-op: 67  Subjective: Doing well.  No vaginal bleeding.  No discharge.  Feels great.    Objective: BP 132/80 (BP Location: Right Arm, Patient Position: Sitting, Cuff Size: Normal)   Pulse 96   Resp 16   Ht 5\' 6"  (1.676 m)   Wt 164 lb (74.4 kg)   LMP 03/03/2017 (Approximate)   BMI 26.47 kg/m   EXAM General: alert and cooperative Resp: clear to auscultation bilaterally Cardio: regular rate and rhythm GI: soft, non-tender; bowel sounds normal; no masses,  no organomegaly and incision: clean, dry and intact Extremities: extremities normal, atraumatic, no cyanosis or edema Vaginal Bleeding: none  Gyn:  NAEFG, vagina without lesions, cuff healing well, can still still small portion of sutue  Assessment: s/p TLH/LSO/cystoscopy Suture still present  Plan: Recheck 4 weeks Pt advised to continue pelvic rest

## 2017-07-03 ENCOUNTER — Encounter: Payer: Self-pay | Admitting: Obstetrics & Gynecology

## 2017-07-03 ENCOUNTER — Ambulatory Visit (INDEPENDENT_AMBULATORY_CARE_PROVIDER_SITE_OTHER): Payer: 59 | Admitting: Obstetrics & Gynecology

## 2017-07-03 VITALS — BP 110/66 | HR 86 | Resp 14 | Ht 66.0 in | Wt 165.0 lb

## 2017-07-03 DIAGNOSIS — G43709 Chronic migraine without aura, not intractable, without status migrainosus: Secondary | ICD-10-CM | POA: Diagnosis not present

## 2017-07-03 MED ORDER — ELETRIPTAN HYDROBROMIDE 40 MG PO TABS: 40.0000 mg | ORAL_TABLET | ORAL | 2 refills | Status: DC | PRN

## 2017-07-03 NOTE — Progress Notes (Signed)
Post Operative Visit  Procedure: HYSTERECTOMY TOTAL LAPAROSCOPIC WITH BILATERAL SALPINGECTOMY, LEFT OOPHERECTOMY, CYSTOSCOPY, INTRAUTERINE DEVICE (IUD) REMOVAL     Days Post-op:  98 days  Subjective: Doing well.  No vaginal bleeding. No pain.  Back at work fully.  Normal bladder and bowel functions.  Here for recheck due to visible piece of V-lock suture visible in the vagina post-operatively.  Reports she's been having more headaches.  H/O migraines.  No aura.  Imitrex isn't working well for her, like it used to work.  Having to take two with each headache and this doesn't always resolved the headache.  Last week, had to take two a day for three days straight.  Has never seen specialist.  Not sure she wants to do that at this time.  Objective: BP 110/66 (BP Location: Right Arm, Patient Position: Sitting, Cuff Size: Normal)   Pulse 86   Resp 14   Ht 5\' 6"  (1.676 m)   Wt 165 lb (74.8 kg)   LMP 03/03/2017 (Approximate)   BMI 26.63 kg/m   EXAM General: alert and cooperative Resp: clear to auscultation bilaterally Cardio: regular rate and rhythm, S1, S2 normal, no murmur, click, rub or gallop GI: soft, non-tender; bowel sounds normal; no masses,  no organomegaly and incision: clean, dry, intact and well healed Extremities: extremities normal, atraumatic, no cyanosis or edema Vaginal Bleeding: none  Gyn:  NAEFG, vaginal without lesions, cuff well approximated and no visible suture.  No suture palpated on physical exam as well.  Assessment: s/p TLH/LSO, right salpingectomy, cystoscopy Migraines with decreased control lately with Imitrex  Plan: Pt may resume all actiives related to any restrictions from surgery. Lubrication encouraged Trial of Relpax 40mg , may repeat in 2hrs.  80mg  mx in 24 hours.  May need referral to neurologist/headache specialist.  Pt will call back to give update.

## 2017-07-06 DIAGNOSIS — G43909 Migraine, unspecified, not intractable, without status migrainosus: Secondary | ICD-10-CM | POA: Insufficient documentation

## 2017-09-24 IMAGING — US US PELVIS COMPLETE
1 series · 15 of 25 positions shown · non-contrast
Comparison: 03/05/2015.

CLINICAL DATA: LEFT lower quadrant pain. Initial encounter. History
of ovarian cysts and fibroids.



[Series 1: us pelvis complete · 62 acquisitions, 15 frames shown]
[im 1/62]
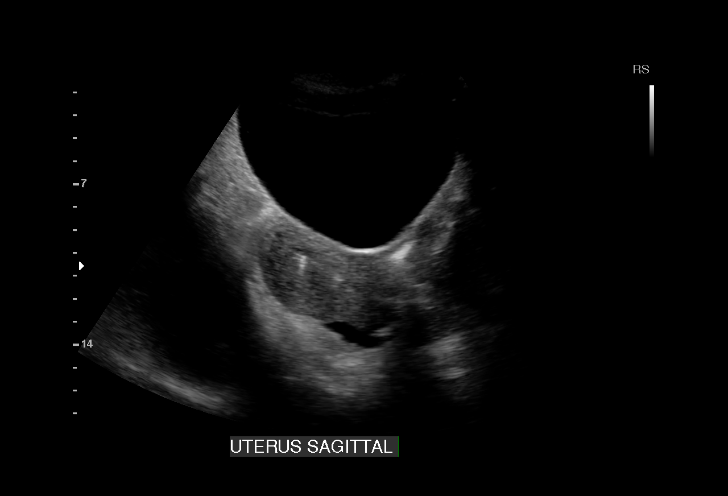
[im 6/62]
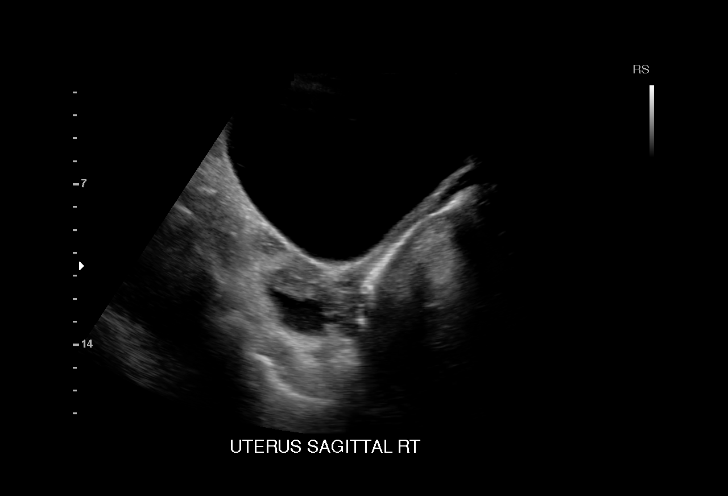
[im 11/62]
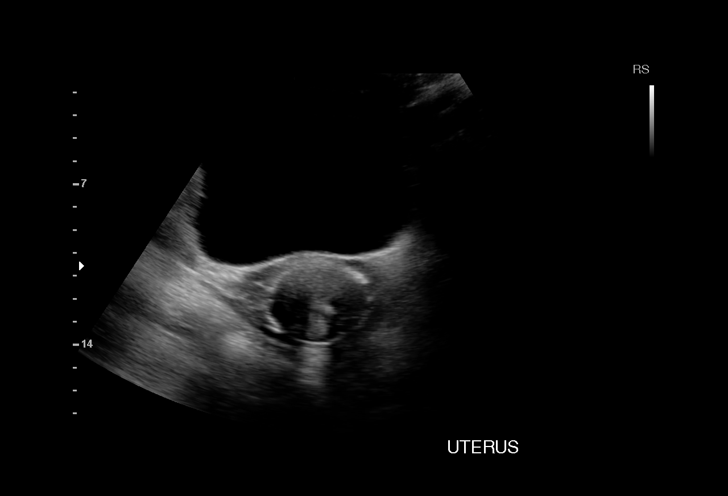
[im 13/62]
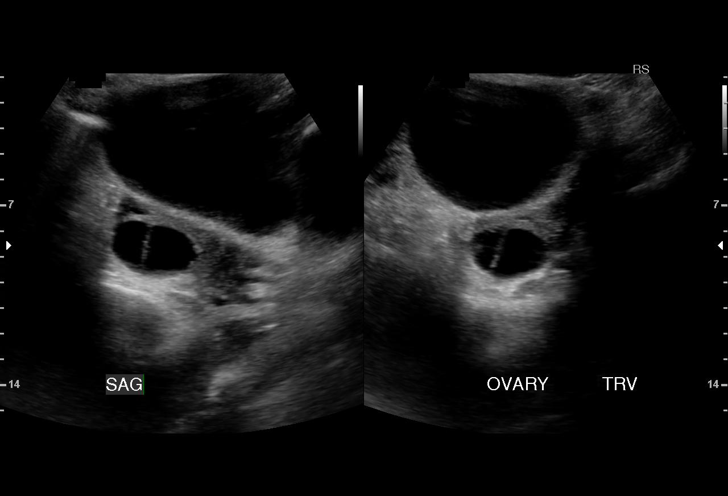
[im 18/62]
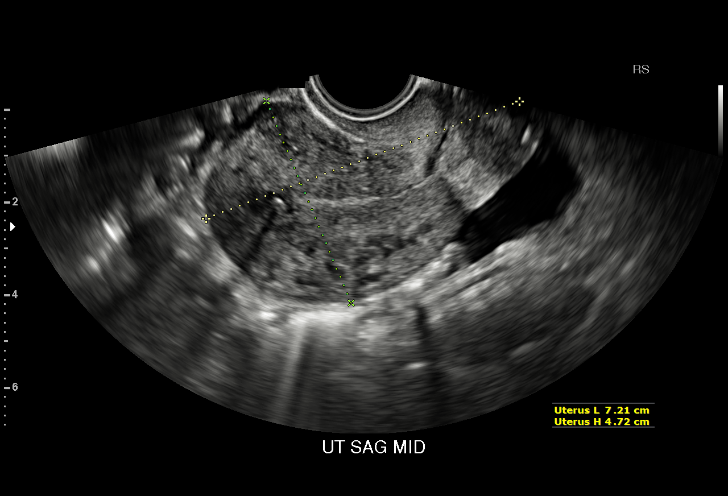
[im 23/62]
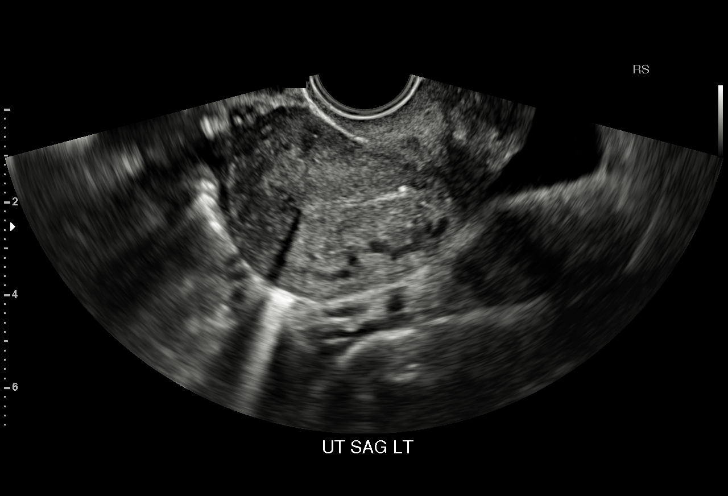
[im 26/62]
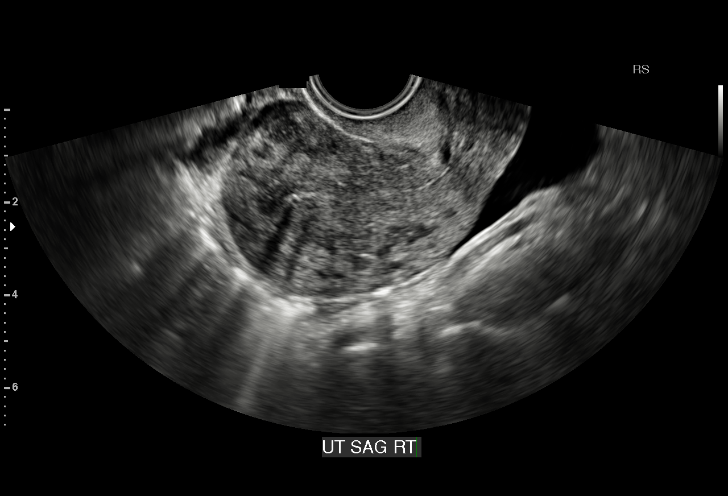
[im 31/62]
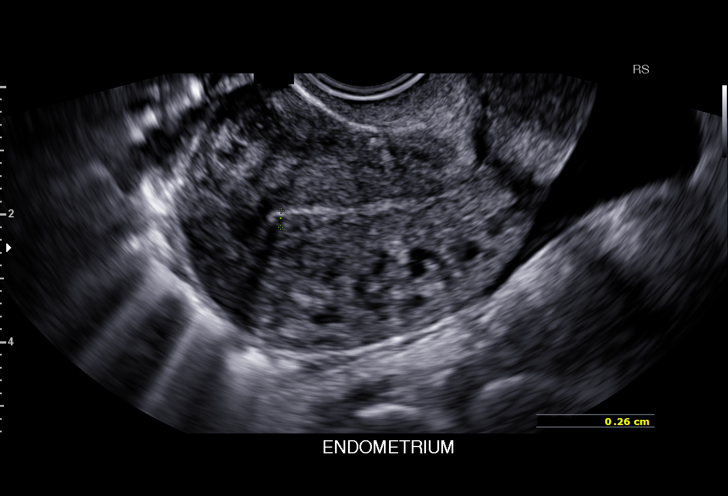
[im 36/62]
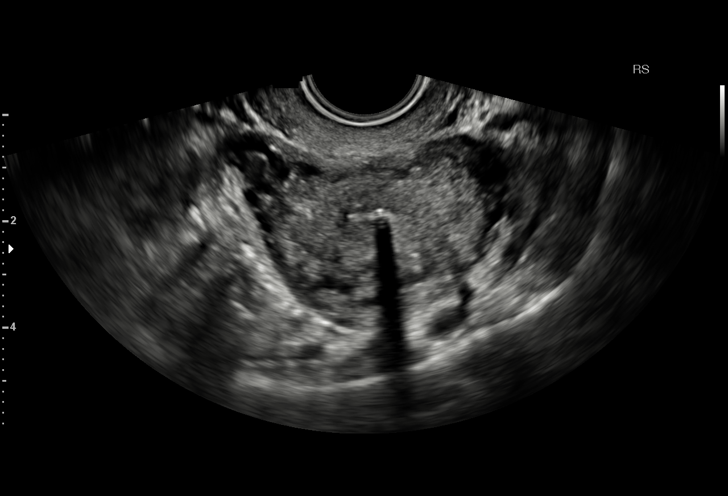
[im 39/62]
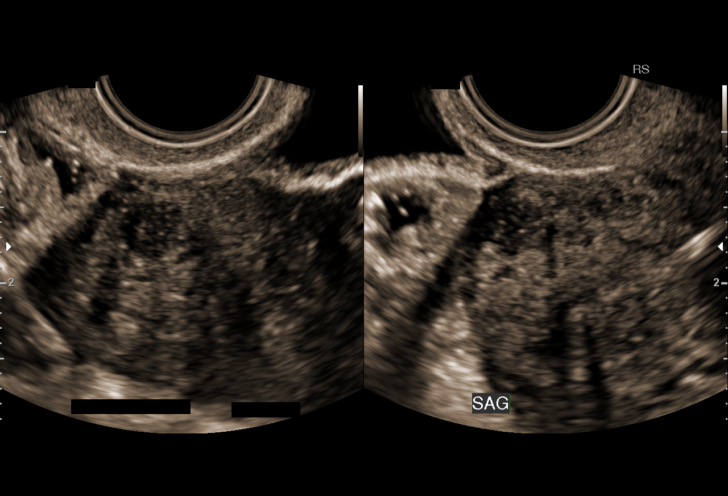
[im 44/62]
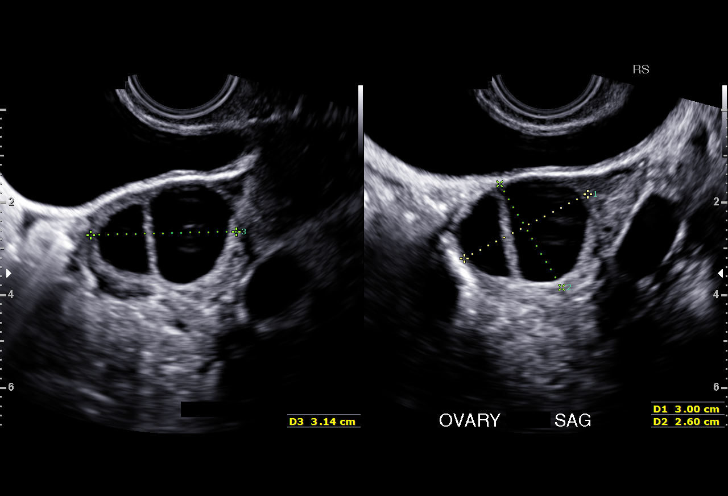
[im 49/62]
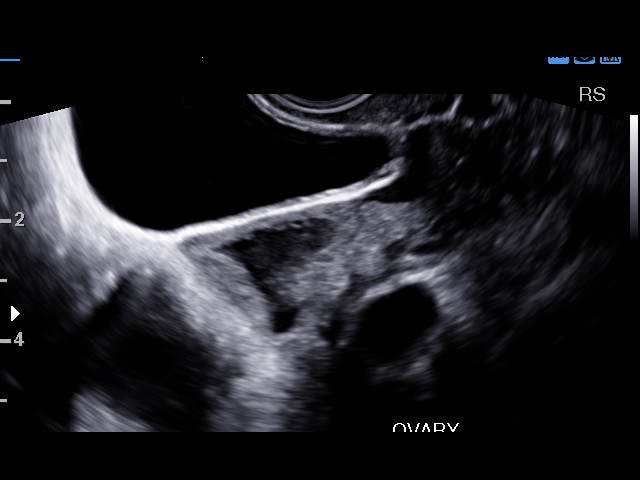
[im 51/62]
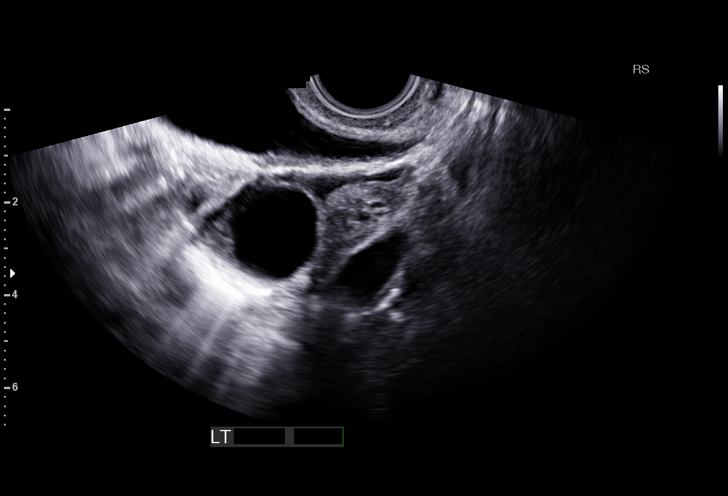
[im 56/62]
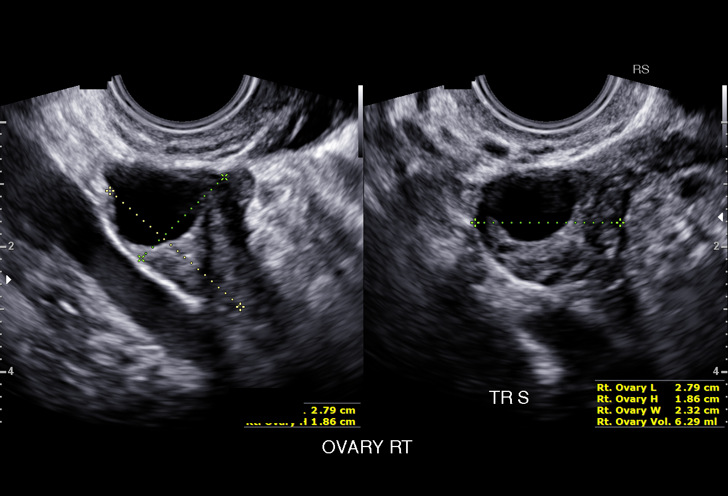
[im 62/62]
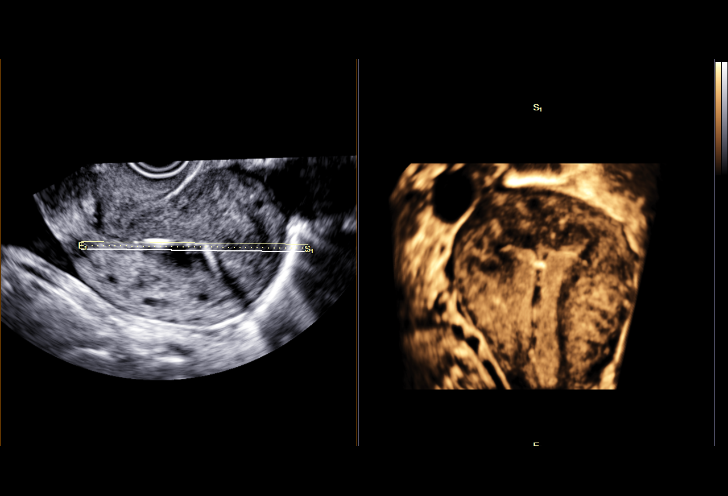

[15 of 25 positions shown; findings below may reference images not displayed]

FINDINGS: Uterus

Measurements: 7.2 cm x 4.7 cm x 5.2 cm.. Myometrial fibroid is
present in the RIGHT anterior fundus. This measures 9 mm x 9 mm x 8
mm.

Endometrium

Thickness: 3 mm.  IUD present..  No focal abnormality visualized.

Right ovary

Measurements: 28 mm x 19 mm x 23 mm. Normal appearance/no adnexal
mass.

Left ovary

Measurements: 44 mm x 25 mm x 25 mm.. Cyst is present measuring 39
mm x 22 mm x 31 mm. There are thick septations with internal
vascular flow.

Other findings

Small amount of free fluid.
IMPRESSION: 1. No acute abnormality.
2. Complex LEFT ovarian cystic lesion with thick septations with
vascular flow. 6-12 week follow-up ultrasound recommended to assess
for resolution.
3. Doppler waveform evaluation was not ordered or performed so
definitive evaluation for portion is not possible.

## 2017-12-13 ENCOUNTER — Telehealth: Payer: Self-pay | Admitting: *Deleted

## 2017-12-13 NOTE — Telephone Encounter (Signed)
Spoke with patient, advised of MMG appointment as seen below. Patient verbalizes understanding and is agreeable.   Written order placed on Dr. Ammie Ferrier desk for signature.

## 2017-12-13 NOTE — Telephone Encounter (Signed)
[name], MD  [facility] Triage Pool  Could you reach out to this pt. She needs her mammogram scheduled at [facility] and will need orders for this. Last MMG 12/14/15 and she is repeating it this year. Thanks.

## 2017-12-13 NOTE — Telephone Encounter (Signed)
Spoke with Neoma Laming at [facility]. Patient scheduled for bilateral screening MMG on 12/18/17 arriving at 8am for 8:15am appt. [place] location.  Fax order to [phone].

## 2017-12-14 NOTE — Telephone Encounter (Signed)
Written order faxed to [facility] at [phone] by Ria Comment.   Routing to provider for final review. Patient is agreeable to disposition. Will close encounter.

## 2018-01-16 ENCOUNTER — Other Ambulatory Visit: Payer: Self-pay | Admitting: Obstetrics & Gynecology

## 2018-01-16 NOTE — Telephone Encounter (Signed)
Medication refill request: Prozac  Last AEX:  12/05/16 SM  Next AEX: 03/01/18  Last MMG (if hormonal medication request): n/a Refill authorized: 12/26/16 #60, 13 RF. Today, please advise.

## 2018-03-01 ENCOUNTER — Other Ambulatory Visit: Payer: Self-pay

## 2018-03-01 ENCOUNTER — Encounter: Payer: Self-pay | Admitting: Obstetrics & Gynecology

## 2018-03-01 ENCOUNTER — Ambulatory Visit: Payer: 59 | Admitting: Obstetrics & Gynecology

## 2018-03-01 VITALS — BP 110/88 | HR 76 | Resp 14 | Ht 66.25 in | Wt 167.0 lb

## 2018-03-01 DIAGNOSIS — Z0000 Encounter for general adult medical examination without abnormal findings: Secondary | ICD-10-CM | POA: Diagnosis not present

## 2018-03-01 DIAGNOSIS — F17200 Nicotine dependence, unspecified, uncomplicated: Secondary | ICD-10-CM | POA: Insufficient documentation

## 2018-03-01 DIAGNOSIS — Z01419 Encounter for gynecological examination (general) (routine) without abnormal findings: Secondary | ICD-10-CM | POA: Diagnosis not present

## 2018-03-01 MED ORDER — FLUOXETINE HCL 20 MG PO CAPS: 20.0000 mg | ORAL_CAPSULE | Freq: Two times a day (BID) | ORAL | 4 refills | Status: AC

## 2018-03-01 MED ORDER — SUMATRIPTAN SUCCINATE 100 MG PO TABS: 100.0000 mg | ORAL_TABLET | Freq: Every day | ORAL | 12 refills | Status: AC | PRN

## 2018-03-01 NOTE — Progress Notes (Signed)
47 y.o. [id number] MarriedAfrican AmericanF here for annual exam.  Father in law passed.  Was out all last week for his family.  Reports itchy rash that started on her back in November.  Did see urgent care.  Was treated with steroids, anti-histamines and antifungals.  None of this really worked.   Denies vaginal bleeding.    Patient's last menstrual period was 03/03/2017 (approximate).          Sexually active: Yes.    The current method of family planning is status post hysterectomy.    Exercising: No.   Smoker:  yes  Health Maintenance: Pap:  12/05/16 Neg   11/26/15 Neg. HR HPV:neg  History of abnormal Pap:  Yes, LEEP 2011 MMG:  12/18/17 BIRADS1:neg  Colonoscopy:  n/a BMD:   n/a TDaP:  2016  Pneumonia vaccine(s):  n/a Shingrix:   n/a Hep C testing: n/a Screening Labs: fasting labs today   reports that she has been smoking e-cigarettes.  She has never used smokeless tobacco. She reports that she drinks about 0.5 - 1.0 oz of alcohol per week. She reports that she does not use drugs.  Past Medical History:  Diagnosis Date  . 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 Elevated LFTs   . Migraine   . Sciatic nerve injury    strain  . Vertigo 4/12    Past Surgical History:  Procedure Laterality Date  . CERVICAL BIOPSY  W/ LOOP ELECTRODE EXCISION  10/11   CIN I & II- Margins Negative  . CYSTOSCOPY N/A 03/27/2017   Procedure: CYSTOSCOPY;  Surgeon: [name], MD;  Location: [facility];  Service: Gynecology;  Laterality: N/A;  . HEEL SPUR SURGERY Left    4/09  . IUD REMOVAL  03/27/2017   Procedure: INTRAUTERINE DEVICE (IUD) REMOVAL;  Surgeon: [name], MD;  Location: [facility];  Service: Gynecology;;  . TOTAL LAPAROSCOPIC HYSTERECTOMY WITH SALPINGECTOMY N/A 03/27/2017   Procedure: HYSTERECTOMY TOTAL LAPAROSCOPIC WITH BILATERAL  SALPINGECTOMY, LEFT OOPHERECTOMY;  Surgeon: [name], MD;  Location: [facility];  Service: Gynecology;  Laterality: N/A;    Current  Outpatient Medications  Medication Sig Dispense Refill  . cetirizine (ZYRTEC) 10 MG tablet Take 10 mg by mouth daily as needed for allergies.    . Cholecalciferol (VITAMIN D-1000 MAX ST) 1000 units tablet Take by mouth daily.    Marland Kitchen FLUoxetine (PROZAC) 20 MG capsule TAKE 1 CAPSULE (20 MG TOTAL) BY MOUTH 2 (TWO) TIMES DAILY. 60 capsule 2  . ibuprofen (ADVIL,MOTRIN) 800 MG tablet Take 1 tablet (800 mg total) by mouth every 8 (eight) hours as needed. 30 tablet 0  . Melatonin 3 MG TABS Take 3 mg by mouth at bedtime as needed (sleep).    . Multiple Vitamins-Minerals (MULTIVITAMIN PO) Take by mouth. Ulta Mega MVI    . OVER THE COUNTER MEDICATION Take 2 tablets by mouth daily. GNC Brand Multi Vitamin and Fish Oil supplement    . SUMAtriptan (IMITREX) 100 MG tablet Take 1 tablet by mouth daily as needed.     No current facility-administered medications for this visit.     Family History  Problem Relation Age of Onset  . Breast cancer Paternal Grandmother        and ovarian  . Diabetes Paternal Grandmother   . Hypertension Mother     Review of Systems  All other systems reviewed and are negative.   Exam:   BP 110/88 (BP  Location: Left Arm, Patient Position: Sitting, Cuff Size: Large)   Pulse 76   Resp 14   Ht 5' 6.25" (1.683 m)   Wt 167 lb (75.8 kg)   LMP 03/03/2017 (Approximate)   BMI 26.75 kg/m    Height: 5' 6.25" (168.3 cm)  Ht Readings from Last 3 Encounters:  03/01/18 5' 6.25" (1.683 m)  07/03/17 5\' 6"  (1.676 m)  06/02/17 5\' 6"  (1.676 m)    General appearance: alert, cooperative and appears stated age Head: Normocephalic, without obvious abnormality, atraumatic Neck: no adenopathy, supple, symmetrical, trachea midline and thyroid normal to inspection and palpation Lungs: clear to auscultation bilaterally Breasts: normal appearance, no masses or tenderness Heart: regular rate and rhythm Abdomen: soft, non-tender; bowel sounds normal; no masses,  no organomegaly Extremities:  extremities normal, atraumatic, no cyanosis or edema Skin: Skin color, texture, turgor normal. No rashes or lesions Lymph nodes: Cervical, supraclavicular, and axillary nodes normal. No abnormal inguinal nodes palpated Neurologic: Grossly normal   Pelvic: External genitalia:  no lesions              Urethra:  normal appearing urethra with no masses, tenderness or lesions              Bartholins and Skenes: normal                 Vagina: normal appearing vagina with normal color and discharge, no lesions              Cervix: absent              Pap taken: No. Bimanual Exam:  Uterus:  uterus absent              Adnexa: no mass, fullness, tenderness               Rectovaginal: Confirms               Anus:  normal sphincter tone, no lesions  Chaperone was present for exam.  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  UTD. pap smear not indicated Colonoscopy guidelines reviewed.   RF for prozac 20mg  bid #180/4RF Imitrex rx to pharmacy  Recommended seeing dermatology return annually or prn

## 2018-03-02 LAB — LIPID PANEL
CHOL/HDL RATIO: 2.9 ratio (ref 0.0–4.4)
Cholesterol, Total: 201 mg/dL — ABNORMAL HIGH (ref 100–199)
HDL: 69 mg/dL (ref >39)
LDL Calculated: 123 mg/dL — ABNORMAL HIGH (ref 0–99)
Triglycerides: 44 mg/dL (ref 0–149)
VLDL Cholesterol Cal: 9 mg/dL (ref 5–40)

## 2018-03-02 LAB — COMPREHENSIVE METABOLIC PANEL
A/G RATIO: 1.8 (ref 1.2–2.2)
ALBUMIN: 4.8 g/dL (ref 3.5–5.5)
ALT: 19 IU/L (ref 0–32)
AST: 26 IU/L (ref 0–40)
Alkaline Phosphatase: 88 IU/L (ref 39–117)
BUN / CREAT RATIO: 15 (ref 9–23)
BUN: 13 mg/dL (ref 6–24)
Bilirubin Total: 0.3 mg/dL (ref 0.0–1.2)
CO2: 25 mmol/L (ref 20–29)
CREATININE: 0.86 mg/dL (ref 0.57–1.00)
Calcium: 10 mg/dL (ref 8.7–10.2)
Chloride: 99 mmol/L (ref 96–106)
GFR calc Af Amer: 95 mL/min/{1.73_m2} (ref >59)
GFR, EST NON AFRICAN AMERICAN: 82 mL/min/{1.73_m2} (ref >59)
GLOBULIN, TOTAL: 2.7 g/dL (ref 1.5–4.5)
Glucose: 91 mg/dL (ref 65–99)
POTASSIUM: 3.8 mmol/L (ref 3.5–5.2)
SODIUM: 141 mmol/L (ref 134–144)
Total Protein: 7.5 g/dL (ref 6.0–8.5)

## 2018-03-02 LAB — CBC
HEMATOCRIT: 38.8 % (ref 34.0–46.6)
HEMOGLOBIN: 12.7 g/dL (ref 11.1–15.9)
MCH: 32.6 pg (ref 26.6–33.0)
MCHC: 32.7 g/dL (ref 31.5–35.7)
MCV: 100 fL — AB (ref 79–97)
Platelets: 294 10*3/uL (ref 150–379)
RBC: 3.89 x10E6/uL (ref 3.77–5.28)
RDW: 14 % (ref 12.3–15.4)
WBC: 4.6 10*3/uL (ref 3.4–10.8)

## 2018-03-02 LAB — VITAMIN D 25 HYDROXY (VIT D DEFICIENCY, FRACTURES): VIT D 25 HYDROXY: 44.2 ng/mL (ref 30.0–100.0)

## 2018-03-02 LAB — TSH: TSH: 0.669 u[IU]/mL (ref 0.450–4.500)

## 2018-07-09 ENCOUNTER — Encounter: Payer: Self-pay | Admitting: Obstetrics & Gynecology

## 2018-07-10 ENCOUNTER — Other Ambulatory Visit: Payer: Self-pay

## 2018-07-10 ENCOUNTER — Encounter: Payer: Self-pay | Admitting: Obstetrics & Gynecology

## 2018-07-10 ENCOUNTER — Ambulatory Visit (INDEPENDENT_AMBULATORY_CARE_PROVIDER_SITE_OTHER): Payer: 59 | Admitting: Obstetrics & Gynecology

## 2018-07-10 VITALS — BP 120/70 | HR 92 | Resp 16 | Ht 66.25 in | Wt 161.0 lb

## 2018-07-10 DIAGNOSIS — L732 Hidradenitis suppurativa: Secondary | ICD-10-CM | POA: Diagnosis not present

## 2018-07-10 DIAGNOSIS — N898 Other specified noninflammatory disorders of vagina: Secondary | ICD-10-CM | POA: Diagnosis not present

## 2018-07-10 DIAGNOSIS — R350 Frequency of micturition: Secondary | ICD-10-CM

## 2018-07-10 LAB — POCT URINALYSIS DIPSTICK
Bilirubin, UA: NEGATIVE
Glucose, UA: NEGATIVE
Ketones, UA: NEGATIVE
NITRITE UA: NEGATIVE
PROTEIN UA: NEGATIVE
RBC UA: NEGATIVE
Urobilinogen, UA: 0.2 E.U./dL
pH, UA: 6 (ref 5.0–8.0)

## 2018-07-10 MED ORDER — CLINDAMYCIN PHOSPHATE 1 % EX SOLN: Freq: Two times a day (BID) | CUTANEOUS | 5 refills | Status: AC

## 2018-07-10 MED ORDER — FLUCONAZOLE 150 MG PO TABS: ORAL_TABLET | ORAL | 0 refills | Status: AC

## 2018-07-10 NOTE — Patient Instructions (Signed)
Hidradenitis Suppurativa Hidradenitis suppurativa is a long-term (chronic) skin disease that starts with blocked sweat glands or hair follicles. Bacteria may grow in these blocked openings of your skin. Hidradenitis suppurativa is like a severe form of acne that develops in areas of your body where acne would be unusual. It is most likely to affect the areas of your body where skin rubs against skin and becomes moist. This includes your:  Underarms.  Groin.  Genital areas.  Buttocks.  Upper thighs.  Breasts.  Hidradenitis suppurativa may start out with small pimples. The pimples can develop into deep sores that break open (rupture) and drain pus. Over time your skin may thicken and become scarred. Hidradenitis suppurativa cannot be passed from person to person. What are the causes? The exact cause of hidradenitis suppurativa is not known. This condition may be due to:  Female and female hormones. The condition is rare before and after puberty.  An overactive body defense system (immune system). Your immune system may overreact to the blocked hair follicles or sweat glands and cause swelling and pus-filled sores.  What increases the risk? You may have a higher risk of hidradenitis suppurativa if you:  Are a woman.  Are between ages 11 and 55.  Have a family history of hidradenitis suppurativa.  Have a personal history of acne.  Are overweight.  Smoke.  Take the drug lithium.  What are the signs or symptoms? The first signs of an outbreak are usually painful skin bumps that look like pimples. As the condition progresses:  Skin bumps may get bigger and grow deeper into the skin.  Bumps under the skin may rupture and drain smelly pus.  Skin may become itchy and infected.  Skin may thicken and scar.  Drainage may continue through tunnels under the skin (fistulas).  Walking and moving your arms can become painful.  How is this diagnosed? Your health care provider may  diagnose hidradenitis suppurativa based on your medical history and your signs and symptoms. A physical exam will also be done. You may need to see a health care provider who specializes in skin diseases (dermatologist). You may also have tests done to confirm the diagnosis. These can include:  Swabbing a sample of pus or drainage from your skin so it can be sent to the lab and tested for infection.  Blood tests to check for infection.  How is this treated? The same treatment will not work for everybody with hidradenitis suppurativa. Your treatment will depend on how severe your symptoms are. You may need to try several treatments to find what works best for you. Part of your treatment may include cleaning and bandaging (dressing) your wounds. You may also have to take medicines, such as the following:  Antibiotics.  Acne medicines.  Medicines to block or suppress the immune system.  A diabetes medicine (metformin) is sometimes used to treat this condition.  For women, birth control pills can sometimes help relieve symptoms.  You may need surgery if you have a severe case of hidradenitis suppurativa that does not respond to medicine. Surgery may involve:  Using a laser to clear the skin and remove hair follicles.  Opening and draining deep sores.  Removing the areas of skin that are diseased and scarred.  Follow these instructions at home:  Learn as much as you can about your disease, and work closely with your health care providers.  Take medicines only as directed by your health care provider.  If you were prescribed   an antibiotic medicine, finish it all even if you start to feel better.  If you are overweight, losing weight may be very helpful. Try to reach and maintain a healthy weight.  Do not use any tobacco products, including cigarettes, chewing tobacco, or electronic cigarettes. If you need help quitting, ask your health care provider.  Do not shave the areas where you  get hidradenitis suppurativa.  Do not wear deodorant.  Wear loose-fitting clothes.  Try not to overheat and get sweaty.  Take a daily bleach bath as directed by your health care provider. ? Fill your bathtub halfway with water. ? Pour in  cup of unscented household bleach. ? Soak for 5-10 minutes.  Cover sore areas with a warm, clean washcloth (compress) for 5-10 minutes. Contact a health care provider if:  You have a flare-up of hidradenitis suppurativa.  You have chills or a fever.  You are having trouble controlling your symptoms at home. This information is not intended to replace advice given to you by your health care provider. Make sure you discuss any questions you have with your health care provider. Document Released: 06/28/2004 Document Revised: 04/21/2016 Document Reviewed: 02/14/2014 Elsevier Interactive Patient Education  2018 [employer]  

## 2018-07-10 NOTE — Progress Notes (Signed)
GYNECOLOGY  VISIT  CC:   Vaginitis  HPI: 46 y.o. [id number] Married [place] American female here for vaginal discharge, itching x 2-3 days.  No odor.  Denies vaginal bleeding.  Denies odor.  Discharge is white and cottage cheese like looking to her.  Having some increased urinary urgency.  No pelvic pain.  No fever.  Denies fever.  Reports she has a tender vulvar nodule present today.  Reports her mother told her that her father had same issues of skin she had.  GYNECOLOGIC HISTORY: Patient's last menstrual period was 03/03/2017 (approximate). Contraception: hysterectomy  Menopausal hormone therapy: none  Patient Active Problem List   Diagnosis Date Noted  . Smoker 03/01/2018  . Migraine 07/06/2017    Past Medical History:  Diagnosis Date  . 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 Elevated LFTs   . Migraine   . Sciatic nerve injury    strain  . Vertigo 4/12    Past Surgical History:  Procedure Laterality Date  . CERVICAL BIOPSY  W/ LOOP ELECTRODE EXCISION  10/11   CIN I & II- Margins Negative  . CYSTOSCOPY N/A 03/27/2017   Procedure: CYSTOSCOPY;  Surgeon: [name], MD;  Location: [facility];  Service: Gynecology;  Laterality: N/A;  . HEEL SPUR SURGERY Left    4/09  . IUD REMOVAL  03/27/2017   Procedure: INTRAUTERINE DEVICE (IUD) REMOVAL;  Surgeon: [name], MD;  Location: [facility];  Service: Gynecology;;  . TOTAL LAPAROSCOPIC HYSTERECTOMY WITH SALPINGECTOMY N/A 03/27/2017   Procedure: HYSTERECTOMY TOTAL LAPAROSCOPIC WITH BILATERAL  SALPINGECTOMY, LEFT OOPHERECTOMY;  Surgeon: [name], MD;  Location: [facility];  Service: Gynecology;  Laterality: N/A;    MEDS:   Current Outpatient Medications on File Prior to Visit  Medication Sig Dispense Refill  . cetirizine (ZYRTEC) 10 MG tablet Take 10 mg by mouth daily as needed for allergies.    . Cholecalciferol (VITAMIN D-1000 MAX ST) 1000 units tablet Take by mouth daily.    Marland Kitchen FLUoxetine (PROZAC) 20 MG  capsule Take 1 capsule (20 mg total) by mouth 2 (two) times daily. 180 capsule 4  . Multiple Vitamins-Minerals (MULTIVITAMIN PO) Take by mouth. Ulta Mega MVI    . OVER THE COUNTER MEDICATION Take 2 tablets by mouth daily. GNC Brand Multi Vitamin and Fish Oil supplement    . SUMAtriptan (IMITREX) 100 MG tablet Take 1 tablet (100 mg total) by mouth daily as needed for migraine. 9 tablet 12   No current facility-administered medications on file prior to visit.     ALLERGIES: Doxycycline and Flagyl [metronidazole]  Family History  Problem Relation Age of Onset  . Breast cancer Paternal Grandmother        and ovarian  . Diabetes Paternal Grandmother   . Hypertension Mother     SH:  Married, non smoker  Review of Systems  Genitourinary: Positive for frequency.       Vaginal discharge Vaginal itching     PHYSICAL EXAMINATION:    BP 120/70 (BP Location: Right Arm, Patient Position: Sitting, Cuff Size: Large)   Pulse 92   Resp 16   Ht 5' 6.25" (1.683 m)   Wt 161 lb (73 kg)   LMP 03/03/2017 (Approximate)   BMI 25.79 kg/m     General appearance: alert, cooperative and appears stated age Lymph:  no inguinal LAD noted  Pelvic: External genitalia: new area of furuncle, draining minimally on left vulva, multiple areas  of prior abscesses with scarring              Urethra:  normal appearing urethra with no masses, tenderness or lesions              Bartholins and Skenes: normal                 Vagina: adherent white discharge without significant odor noted, c/w yeast, normal appearing vaginal mucosa otherwise, no lesions noted              Cervix: absent              Bimanual Exam:  Uterus:  uterus absent              Adnexa: no mass, fullness, tenderness              Rectovaginal: No.  Chaperone was present for exam.  Assessment: Vaginal discharge c/w yeast Hidradenitis with current vulvar furuncle Smoker  Plan: Diflucan 150mg  po x 1, repeat 72 hours.  Ok to use OTC  clotrimazole cream. Affirm pending Vulvar skin culture obtianed Clindamycin 1% solution BID to pharmacy for her to use on area of hidradenitis Pt is aware smoking does contribute to skin issues

## 2018-07-11 DIAGNOSIS — L732 Hidradenitis suppurativa: Secondary | ICD-10-CM | POA: Insufficient documentation

## 2018-07-12 ENCOUNTER — Other Ambulatory Visit: Payer: Self-pay | Admitting: *Deleted

## 2018-07-12 LAB — WOUND CULTURE

## 2018-07-12 LAB — VAGINITIS/VAGINOSIS, DNA PROBE
CANDIDA SPECIES: POSITIVE — AB
GARDNERELLA VAGINALIS: POSITIVE — AB
Trichomonas vaginosis: NEGATIVE

## 2018-07-12 MED ORDER — CLINDAMYCIN PHOSPHATE 2 % VA CREA: 1.0000 | TOPICAL_CREAM | Freq: Every day | VAGINAL | 0 refills | Status: AC

## 2018-08-20 ENCOUNTER — Encounter: Payer: Self-pay | Admitting: Obstetrics & Gynecology

## 2018-08-20 ENCOUNTER — Telehealth: Payer: Self-pay | Admitting: Obstetrics & Gynecology

## 2018-08-20 NOTE — Telephone Encounter (Signed)
Spoke with patient. She thinks she may have yeast after treatment with Metrogel.  Cannot come for appointment as she lives out of town.    Patient declined OV. Reccommended  Monistat 3 (OTC) and Hydrocortisone ointment for external irritation. Advised to keep area clean and dry. Change Damp clothes as soon as possible.    Pt verbalized understanding and will call back if symptoms do not improve after OTC treatment.   Message to Dr. Sabra Heck and will close.

## 2018-08-20 NOTE — Telephone Encounter (Signed)
Return call to patient.  She will call back in ten minutes.  If she would like to be seen for yeast, can have office visit with Dr. Sabra Heck at her convenience.

## 2018-08-20 NOTE — Telephone Encounter (Signed)
Patient sent the following correspondence through [facility]. Routing to triage to assist patient with request.  I took the medicine for a yeast infection and then took the medicine for BV, however now I have another yeast infection. Dr Sabra Heck advised that this could occur after taking the BV medication.  Please advise

## 2018-11-20 ENCOUNTER — Other Ambulatory Visit: Payer: Self-pay | Admitting: Obstetrics & Gynecology

## 2019-01-14 ENCOUNTER — Other Ambulatory Visit: Payer: Self-pay | Admitting: Obstetrics & Gynecology

## 2019-05-10 ENCOUNTER — Ambulatory Visit: Payer: 59 | Admitting: Obstetrics & Gynecology

## 2019-05-10 ENCOUNTER — Encounter
